# Patient Record
Sex: Female | Born: 1940 | Race: White | Hispanic: No | Smoking: Never smoker
Health system: Southern US, Community
[De-identification: ages and names within clinical notes are randomized; demographics above are authoritative.]

## PROBLEM LIST (undated history)

## (undated) DIAGNOSIS — C50919 Malignant neoplasm of unspecified site of unspecified female breast: Secondary | ICD-10-CM

## (undated) DIAGNOSIS — M1990 Unspecified osteoarthritis, unspecified site: Secondary | ICD-10-CM

## (undated) DIAGNOSIS — H9319 Tinnitus, unspecified ear: Secondary | ICD-10-CM

## (undated) DIAGNOSIS — G629 Polyneuropathy, unspecified: Secondary | ICD-10-CM

## (undated) DIAGNOSIS — I10 Essential (primary) hypertension: Secondary | ICD-10-CM

## (undated) DIAGNOSIS — M4800 Spinal stenosis, site unspecified: Secondary | ICD-10-CM

## (undated) DIAGNOSIS — I059 Rheumatic mitral valve disease, unspecified: Secondary | ICD-10-CM

## (undated) DIAGNOSIS — E7800 Pure hypercholesterolemia, unspecified: Secondary | ICD-10-CM

## (undated) DIAGNOSIS — I38 Endocarditis, valve unspecified: Secondary | ICD-10-CM

## (undated) HISTORY — PX: JOINT REPLACEMENT: SHX530

## (undated) HISTORY — PX: TONSILLECTOMY: SUR1361

## (undated) HISTORY — PX: OTHER SURGICAL HISTORY: SHX169

## (undated) HISTORY — PX: COSMETIC SURGERY: SHX468

## (undated) HISTORY — PX: EYE SURGERY: SHX253

## (undated) HISTORY — PX: BREAST SURGERY: SHX581

---

## 1984-03-28 HISTORY — PX: ABDOMINAL HYSTERECTOMY: SHX81

## 1996-01-24 HISTORY — PX: TOTAL MASTECTOMY: SHX6129

## 1998-03-28 HISTORY — PX: BREAST SURGERY: SHX581

## 1998-06-30 ENCOUNTER — Other Ambulatory Visit: Admission: RE | Admit: 1998-06-30 | Discharge: 1998-06-30 | Payer: Self-pay | Admitting: Gastroenterology

## 2003-04-11 ENCOUNTER — Ambulatory Visit (HOSPITAL_COMMUNITY): Admission: RE | Admit: 2003-04-11 | Discharge: 2003-04-11 | Payer: Self-pay | Admitting: Gastroenterology

## 2008-05-21 ENCOUNTER — Ambulatory Visit: Payer: Self-pay | Admitting: Internal Medicine

## 2008-06-06 ENCOUNTER — Ambulatory Visit: Payer: Self-pay | Admitting: Internal Medicine

## 2010-03-26 HISTORY — PX: MOHS SURGERY: SUR867

## 2010-04-17 ENCOUNTER — Encounter: Payer: Self-pay | Admitting: Gastroenterology

## 2013-03-28 HISTORY — PX: CATARACT EXTRACTION, BILATERAL: SHX1313

## 2014-10-20 ENCOUNTER — Encounter: Payer: Self-pay | Admitting: Internal Medicine

## 2017-06-19 ENCOUNTER — Ambulatory Visit (INDEPENDENT_AMBULATORY_CARE_PROVIDER_SITE_OTHER): Payer: Medicare Other

## 2017-06-19 ENCOUNTER — Encounter (INDEPENDENT_AMBULATORY_CARE_PROVIDER_SITE_OTHER): Payer: Self-pay | Admitting: Orthopaedic Surgery

## 2017-06-19 ENCOUNTER — Ambulatory Visit (INDEPENDENT_AMBULATORY_CARE_PROVIDER_SITE_OTHER): Payer: Medicare Other | Admitting: Orthopaedic Surgery

## 2017-06-19 DIAGNOSIS — G8929 Other chronic pain: Secondary | ICD-10-CM

## 2017-06-19 DIAGNOSIS — M5441 Lumbago with sciatica, right side: Secondary | ICD-10-CM

## 2017-06-19 DIAGNOSIS — M1611 Unilateral primary osteoarthritis, right hip: Secondary | ICD-10-CM

## 2017-06-19 DIAGNOSIS — M25551 Pain in right hip: Secondary | ICD-10-CM | POA: Insufficient documentation

## 2017-06-19 DIAGNOSIS — M1612 Unilateral primary osteoarthritis, left hip: Secondary | ICD-10-CM | POA: Insufficient documentation

## 2017-06-19 HISTORY — DX: Other chronic pain: G89.29

## 2017-06-19 HISTORY — DX: Unilateral primary osteoarthritis, left hip: M16.12

## 2017-06-19 HISTORY — DX: Pain in right hip: M25.551

## 2017-06-19 HISTORY — DX: Unilateral primary osteoarthritis, right hip: M16.11

## 2017-06-19 NOTE — Progress Notes (Signed)
Dependent review of plain films of both hips show  Office Visit Note   Patient: Kristi Wiley           Date of Birth: 07-23-1940           MRN: [id number] Visit Date: 06/19/2017              Requested by: No referring provider defined for this encounter. PCP: Reita Cliche, MD   Assessment & Plan: Visit Diagnoses:  1. Chronic bilateral low back pain with right-sided sciatica   2. Unilateral primary osteoarthritis, left hip   3. Unilateral primary osteoarthritis, right hip   4. Pain of right hip joint     Plan: Given the severity of her pain and the detrimental effects is on her mobility and her activity daily living we are recommending hip replacement surgery.  This also correlates with her clinical exam and x-rays.  She would like to think about this.  I talked in detail about the risk and benefits of the surgery and what her intraoperative and postoperative course were involved.  She will continue meloxicam for now.  All questions and concerns were answered and addressed.  I have been talked about the possibility of intra-articular steroid injection to help temporize her symptoms.  She says she will take all these things in consideration and give [place] a call with what she would like to try next.  Follow-Up Instructions: Return if symptoms worsen or fail to improve.   Orders:  Orders Placed This Encounter  Procedures  . XR Lumbar Spine 2-3 Views   No orders of the defined types were placed in this encounter.     Procedures: No procedures performed   Clinical Data: No additional findings.   Subjective: Chief Complaint  Patient presents with  . Right Leg - Pain  The patient comes in with a chief complaint of bilateral hip pain with the right worse than left as well as hip stiffness but she also has pain going down her right leg in the thigh area and sometimes numbness and tingling in her right toes.  She is not a diabetic.  She was started on meloxicam by her primary care  physician and she says that this did take the edge of the things that she has to take Prilosec with the meloxicam.  Her pain is daily and is detrimental effect directives daily living, quality of life, mobility.  It affects her mobility mainly.  It does wake her up at night and causes pain with activities.  She denies any injuries that she is aware of.  Is been slowly worsening for well over a year now but got quite severe in December.  HPI  Review of Systems She currently denies any headache, chest pain, shortness of breath, fever, chills, nausea, vomiting.  Objective: Vital Signs: There were no vitals taken for this visit.  Physical Exam She is alert and oriented x3 and in no acute distress Ortho Exam Examination of both hips shows significant limitations with internal/external rotation and pain in the groin with internal or external rotation of both hips with the right worse than left.  She has good strength in her bilateral lower extremities and no numbness and tingling today.   Specialty Comments:  No specialty comments available.  Imaging: Xr Lumbar Spine 2-3 Views  Result Date: 06/19/2017 2 views of the lumbar spine show mild to moderate arthritic changes at several levels.  She has severe degenerative disc disease at L5-S1 versus a  transitional vertebra.  Severe arthritic changes of both hips with joint space narrowing, sclerotic changes and large periarticular osteophytes.  PMFS History: Patient Active Problem List   Diagnosis Date Noted  . Pain of right hip joint 06/19/2017  . Unilateral primary osteoarthritis, 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  . Unilateral primary osteoarthritis, 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  . 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   History reviewed. No pertinent past medical history.  History reviewed. No pertinent family history.  History reviewed. No pertinent surgical history. Social History   Occupational History  . Not on file    Tobacco Use  . Smoking status: Not on file  Substance and Sexual Activity  . Alcohol use: Not on file  . Drug use: Not on file  . Sexual activity: Not on file

## 2017-09-04 ENCOUNTER — Other Ambulatory Visit (INDEPENDENT_AMBULATORY_CARE_PROVIDER_SITE_OTHER): Payer: Self-pay

## 2017-09-22 ENCOUNTER — Other Ambulatory Visit (INDEPENDENT_AMBULATORY_CARE_PROVIDER_SITE_OTHER): Payer: Self-pay | Admitting: Physician Assistant

## 2017-09-29 NOTE — Progress Notes (Signed)
[facility] DR Donnetta Hutching 03-30-17 Epic

## 2017-09-29 NOTE — Patient Instructions (Addendum)
Kristi Wiley  09/29/2017   Your procedure is scheduled on: 10-06-17   Report to [facility] Main  Entrance    Report to admitting at 6:00AM, surgery will start at 8:45am!    Call this number if you have problems the morning of surgery [phone]     Remember: Do not eat food or drink liquids :After Midnight.     Take these medicines the morning of surgery with A SIP OF WATER: EYE DROPS, LABETALOL, PRAVASTATIN                                You may not have any metal on your body including hair pins and              piercings  Do not wear jewelry, make-up, lotions, powders or perfumes, deodorant             Do not wear nail polish.  Do not shave  48 hours prior to surgery.              Do not bring valuables to the hospital. [place].  Contacts, dentures or bridgework may not be worn into surgery.  Leave suitcase in the car. After surgery it may be brought to your room.                  Please read over the following fact sheets you were given: _____________________________________________________________________             [facility] - Preparing for Surgery Before surgery, you can play an important role.  Because skin is not sterile, your skin needs to be as free of germs as possible.  You can reduce the number of germs on your skin by washing with CHG (chlorahexidine gluconate) soap before surgery.  CHG is an antiseptic cleaner which kills germs and bonds with the skin to continue killing germs even after washing. Please DO NOT use if you have an allergy to CHG or antibacterial soaps.  If your skin becomes reddened/irritated stop using the CHG and inform your nurse when you arrive at Short Stay. Do not shave (including legs and underarms) for at least 48 hours prior to the first CHG shower.  You may shave your face/neck. Please follow these instructions carefully:  1.  Shower with CHG Soap the night  before surgery and the  morning of Surgery.  2.  If you choose to wash your hair, wash your hair first as usual with your  normal  shampoo.  3.  After you shampoo, rinse your hair and body thoroughly to remove the  shampoo.                           4.  Use CHG as you would any other liquid soap.  You can apply chg directly  to the skin and wash                       Gently with a scrungie or clean washcloth.  5.  Apply the CHG Soap to your body ONLY FROM THE NECK DOWN.   Do not use on face/ open  Wound or open sores. Avoid contact with eyes, ears mouth and genitals (private parts).                       Wash face,  Genitals (private parts) with your normal soap.             6.  Wash thoroughly, paying special attention to the area where your surgery  will be performed.  7.  Thoroughly rinse your body with warm water from the neck down.  8.  DO NOT shower/wash with your normal soap after using and rinsing off  the CHG Soap.                9.  Pat yourself dry with a clean towel.            10.  Wear clean pajamas.            11.  Place clean sheets on your bed the night of your first shower and do not  sleep with pets. Day of Surgery : Do not apply any lotions/deodorants the morning of surgery.  Please wear clean clothes to the hospital/surgery center.  FAILURE TO FOLLOW THESE INSTRUCTIONS MAY RESULT IN THE CANCELLATION OF YOUR SURGERY PATIENT SIGNATURE_________________________________  NURSE SIGNATURE__________________________________  ________________________________________________________________________   Adam Phenix  An incentive spirometer is a tool that can help keep your lungs clear and active. This tool measures how well you are filling your lungs with each breath. Taking long deep breaths may help reverse or decrease the chance of developing breathing (pulmonary) problems (especially infection) following:  A long period of time when you are  unable to move or be active. BEFORE THE PROCEDURE   If the spirometer includes an indicator to show your best effort, your nurse or respiratory therapist will set it to a desired goal.  If possible, sit up straight or lean slightly forward. Try not to slouch.  Hold the incentive spirometer in an upright position. INSTRUCTIONS FOR USE  1. Sit on the edge of your bed if possible, or sit up as far as you can in bed or on a chair. 2. Hold the incentive spirometer in an upright position. 3. Breathe out normally. 4. Place the mouthpiece in your mouth and seal your lips tightly around it. 5. Breathe in slowly and as deeply as possible, raising the piston or the ball toward the top of the column. 6. Hold your breath for 3-5 seconds or for as long as possible. Allow the piston or ball to fall to the bottom of the column. 7. Remove the mouthpiece from your mouth and breathe out normally. 8. Rest for a few seconds and repeat Steps 1 through 7 at least 10 times every 1-2 hours when you are awake. Take your time and take a few normal breaths between deep breaths. 9. The spirometer may include an indicator to show your best effort. Use the indicator as a goal to work toward during each repetition. 10. After each set of 10 deep breaths, practice coughing to be sure your lungs are clear. If you have an incision (the cut made at the time of surgery), support your incision when coughing by placing a pillow or rolled up towels firmly against it. Once you are able to get out of bed, walk around indoors and cough well. You may stop using the incentive spirometer when instructed by your caregiver.  RISKS AND COMPLICATIONS  Take your time so you do not get  dizzy or light-headed.  If you are in pain, you may need to take or ask for pain medication before doing incentive spirometry. It is harder to take a deep breath if you are having pain. AFTER USE  Rest and breathe slowly and easily.  It can be helpful to  keep track of a log of your progress. Your caregiver can provide you with a simple table to help with this. If you are using the spirometer at home, follow these instructions: [place] IF:   You are having difficultly using the spirometer.  You have trouble using the spirometer as often as instructed.  Your pain medication is not giving enough relief while using the spirometer.  You develop fever of 100.5 F (38.1 C) or higher. SEEK IMMEDIATE MEDICAL CARE IF:   You cough up bloody sputum that had not been present before.  You develop fever of 102 F (38.9 C) or greater.  You develop worsening pain at or near the incision site. MAKE SURE YOU:   Understand these instructions.  Will watch your condition.  Will get help right away if you are not doing well or get worse. Document Released: 07/25/2006 Document Revised: 06/06/2011 Document Reviewed: 09/25/2006 [facility] Patient Information 2014 [place], [place].   ________________________________________________________________________

## 2017-10-02 ENCOUNTER — Encounter (HOSPITAL_COMMUNITY): Payer: Self-pay | Admitting: Emergency Medicine

## 2017-10-02 ENCOUNTER — Other Ambulatory Visit: Payer: Self-pay

## 2017-10-02 ENCOUNTER — Encounter (HOSPITAL_COMMUNITY)
Admission: RE | Admit: 2017-10-02 | Discharge: 2017-10-02 | Disposition: A | Discharge status: Home / Self Care | Payer: Medicare Other | Source: Ambulatory Visit | Attending: Orthopaedic Surgery | Admitting: Orthopaedic Surgery

## 2017-10-02 DIAGNOSIS — R001 Bradycardia, unspecified: Secondary | ICD-10-CM | POA: Diagnosis not present

## 2017-10-02 DIAGNOSIS — Z01810 Encounter for preprocedural cardiovascular examination: Secondary | ICD-10-CM | POA: Insufficient documentation

## 2017-10-02 DIAGNOSIS — I10 Essential (primary) hypertension: Secondary | ICD-10-CM | POA: Diagnosis not present

## 2017-10-02 DIAGNOSIS — Z01812 Encounter for preprocedural laboratory examination: Secondary | ICD-10-CM | POA: Diagnosis not present

## 2017-10-02 HISTORY — DX: Tinnitus, unspecified ear: H93.19

## 2017-10-02 HISTORY — DX: Malignant neoplasm of unspecified site of unspecified female breast: C50.919

## 2017-10-02 HISTORY — DX: Essential (primary) hypertension: I10

## 2017-10-02 HISTORY — DX: Unspecified osteoarthritis, unspecified site: M19.90

## 2017-10-02 HISTORY — DX: Rheumatic mitral valve disease, unspecified: I05.9

## 2017-10-02 HISTORY — DX: Pure hypercholesterolemia, unspecified: E78.00

## 2017-10-02 LAB — CBC
HCT: 39.9 % (ref 36.0–46.0)
HEMOGLOBIN: 13.6 g/dL (ref 12.0–15.0)
MCH: 30.5 pg (ref 26.0–34.0)
MCHC: 34.1 g/dL (ref 30.0–36.0)
MCV: 89.5 fL (ref 78.0–100.0)
Platelets: 242 10*3/uL (ref 150–400)
RBC: 4.46 MIL/uL (ref 3.87–5.11)
RDW: 13.1 % (ref 11.5–15.5)
WBC: 7.1 10*3/uL (ref 4.0–10.5)

## 2017-10-02 LAB — BASIC METABOLIC PANEL
ANION GAP: 7 (ref 5–15)
BUN: 22 mg/dL (ref 8–23)
CHLORIDE: 105 mmol/L (ref 98–111)
CO2: 30 mmol/L (ref 22–32)
Calcium: 9.5 mg/dL (ref 8.9–10.3)
Creatinine, Ser: 0.82 mg/dL (ref 0.44–1.00)
GLUCOSE: 102 mg/dL — AB (ref 70–99)
POTASSIUM: 3.8 mmol/L (ref 3.5–5.1)
SODIUM: 142 mmol/L (ref 135–145)

## 2017-10-02 LAB — SURGICAL PCR SCREEN
MRSA, PCR: NEGATIVE
STAPHYLOCOCCUS AUREUS: NEGATIVE

## 2017-10-02 NOTE — Progress Notes (Signed)
FAX REQUEST SENT TO Cardiac Ultrasound - [place] Bldg REQUESTING LAST ECHO RESULTS

## 2017-10-03 NOTE — Progress Notes (Signed)
ECHO 04-21-17 on chart from [place]

## 2017-10-05 ENCOUNTER — Encounter (HOSPITAL_COMMUNITY): Payer: Self-pay | Admitting: Anesthesiology

## 2017-10-05 MED ORDER — SODIUM CHLORIDE 0.9 % IV SOLN
1000.0000 mg | INTRAVENOUS | Status: AC
  Administered 2017-10-06: 1000 mg via INTRAVENOUS
  Filled 2017-10-05: qty 1100

## 2017-10-05 NOTE — Anesthesia Preprocedure Evaluation (Addendum)
Anesthesia Evaluation  Patient identified by MRN, date of birth, ID band Patient awake    Reviewed: Allergy & Precautions, NPO status , Patient's Chart, lab work & pertinent test results  History of Anesthesia Complications Negative for: history of anesthetic complications  Airway Mallampati: II  TM Distance: >3 FB Neck ROM: Full    Dental  (+) Teeth Intact   Pulmonary neg pulmonary ROS,    breath sounds clear to auscultation       Cardiovascular hypertension, Pt. on medications and Pt. 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  Rhythm:Regular  EF nl, 2+ MR   Neuro/Psych neg Seizures  Neuromuscular disease negative psych ROS   GI/Hepatic negative GI ROS, Neg liver ROS,   Endo/Other  negative endocrine ROS  Renal/GU negative Renal ROS     Musculoskeletal  (+) Arthritis ,   Abdominal   Peds  Hematology negative hematology ROS (+)   Anesthesia Other Findings   Reproductive/Obstetrics                            Anesthesia Physical Anesthesia Plan  ASA: II  Anesthesia Plan: Spinal and MAC   Post-op Pain Management:    Induction:   PONV Risk Score and Plan: 2 and Treatment may vary due to age or medical condition  Airway Management Planned: Natural Airway, Simple Face Mask and Nasal Cannula  Additional Equipment: None  Intra-op Plan:   Post-operative Plan:   Informed Consent: I have reviewed the patients History and Physical, chart, labs and discussed the procedure including the risks, benefits and alternatives for the proposed anesthesia with the patient or authorized representative who has indicated his/her understanding and acceptance.   Dental advisory given  Plan Discussed with: CRNA and Surgeon  Anesthesia Plan Comments:        Anesthesia Quick Evaluation

## 2017-10-06 ENCOUNTER — Inpatient Hospital Stay (HOSPITAL_COMMUNITY)
Admission: RE | Admit: 2017-10-06 | Discharge: 2017-10-08 | DRG: 470 | Disposition: A | Discharge status: Home Health | Payer: Medicare Other | Attending: Orthopaedic Surgery | Admitting: Orthopaedic Surgery

## 2017-10-06 ENCOUNTER — Inpatient Hospital Stay (HOSPITAL_COMMUNITY): Payer: Medicare Other | Admitting: Anesthesiology

## 2017-10-06 ENCOUNTER — Encounter (HOSPITAL_COMMUNITY)
Admission: RE | Disposition: A | Discharge status: Home Health | Payer: Self-pay | Source: Home / Self Care | Attending: Orthopaedic Surgery

## 2017-10-06 ENCOUNTER — Other Ambulatory Visit: Payer: Self-pay

## 2017-10-06 ENCOUNTER — Inpatient Hospital Stay (HOSPITAL_COMMUNITY): Payer: Medicare Other

## 2017-10-06 ENCOUNTER — Encounter (HOSPITAL_COMMUNITY): Payer: Self-pay | Admitting: Emergency Medicine

## 2017-10-06 DIAGNOSIS — Z85828 Personal history of other malignant neoplasm of skin: Secondary | ICD-10-CM

## 2017-10-06 DIAGNOSIS — Z882 Allergy status to sulfonamides status: Secondary | ICD-10-CM

## 2017-10-06 DIAGNOSIS — Z9181 History of falling: Secondary | ICD-10-CM

## 2017-10-06 DIAGNOSIS — Z9089 Acquired absence of other organs: Secondary | ICD-10-CM | POA: Diagnosis not present

## 2017-10-06 DIAGNOSIS — Z9011 Acquired absence of right breast and nipple: Secondary | ICD-10-CM

## 2017-10-06 DIAGNOSIS — Z91040 Latex allergy status: Secondary | ICD-10-CM

## 2017-10-06 DIAGNOSIS — M5441 Lumbago with sciatica, right side: Secondary | ICD-10-CM | POA: Diagnosis present

## 2017-10-06 DIAGNOSIS — M160 Bilateral primary osteoarthritis of hip: Principal | ICD-10-CM | POA: Diagnosis present

## 2017-10-06 DIAGNOSIS — M25751 Osteophyte, right hip: Secondary | ICD-10-CM | POA: Diagnosis not present

## 2017-10-06 DIAGNOSIS — Q239 Congenital malformation of aortic and mitral valves, unspecified: Secondary | ICD-10-CM | POA: Diagnosis not present

## 2017-10-06 DIAGNOSIS — Z888 Allergy status to other drugs, medicaments and biological substances status: Secondary | ICD-10-CM | POA: Diagnosis not present

## 2017-10-06 DIAGNOSIS — G8929 Other chronic pain: Secondary | ICD-10-CM | POA: Diagnosis present

## 2017-10-06 DIAGNOSIS — M1610 Unilateral primary osteoarthritis, unspecified hip: Secondary | ICD-10-CM

## 2017-10-06 DIAGNOSIS — Z90710 Acquired absence of both cervix and uterus: Secondary | ICD-10-CM | POA: Diagnosis not present

## 2017-10-06 DIAGNOSIS — Z853 Personal history of malignant neoplasm of breast: Secondary | ICD-10-CM | POA: Diagnosis not present

## 2017-10-06 DIAGNOSIS — Z9841 Cataract extraction status, right eye: Secondary | ICD-10-CM

## 2017-10-06 DIAGNOSIS — E7800 Pure hypercholesterolemia, unspecified: Secondary | ICD-10-CM | POA: Diagnosis not present

## 2017-10-06 DIAGNOSIS — Z96641 Presence of right artificial hip joint: Secondary | ICD-10-CM

## 2017-10-06 DIAGNOSIS — M1611 Unilateral primary osteoarthritis, right hip: Secondary | ICD-10-CM | POA: Diagnosis present

## 2017-10-06 DIAGNOSIS — I10 Essential (primary) hypertension: Secondary | ICD-10-CM | POA: Diagnosis present

## 2017-10-06 DIAGNOSIS — Z9842 Cataract extraction status, left eye: Secondary | ICD-10-CM

## 2017-10-06 HISTORY — PX: TOTAL HIP ARTHROPLASTY: SHX124

## 2017-10-06 HISTORY — DX: Presence of right artificial hip joint: Z96.641

## 2017-10-06 SURGERY — ARTHROPLASTY, HIP, TOTAL, ANTERIOR APPROACH
Anesthesia: Monitor Anesthesia Care | Site: Hip | Laterality: Right

## 2017-10-06 MED ORDER — 0.9 % SODIUM CHLORIDE (POUR BTL) OPTIME
TOPICAL | Status: DC | PRN
  Administered 2017-10-06: 1000 mL

## 2017-10-06 MED ORDER — HYDROCODONE-ACETAMINOPHEN 5-325 MG PO TABS
1.0000 | ORAL_TABLET | ORAL | Status: DC | PRN
  Administered 2017-10-07: 1 via ORAL
  Filled 2017-10-06: qty 1

## 2017-10-06 MED ORDER — LIDOCAINE 2% (20 MG/ML) 5 ML SYRINGE
INTRAMUSCULAR | Status: AC
  Filled 2017-10-06: qty 5

## 2017-10-06 MED ORDER — PANTOPRAZOLE SODIUM 40 MG PO TBEC
40.0000 mg | DELAYED_RELEASE_TABLET | Freq: Every day | ORAL | Status: DC
  Administered 2017-10-06 – 2017-10-08 (×3): 40 mg via ORAL
  Filled 2017-10-06 (×3): qty 1

## 2017-10-06 MED ORDER — PROPOFOL 10 MG/ML IV BOLUS
INTRAVENOUS | Status: AC
  Filled 2017-10-06: qty 20

## 2017-10-06 MED ORDER — FENTANYL CITRATE (PF) 100 MCG/2ML IJ SOLN: 25.0000 ug | INTRAMUSCULAR | Status: DC | PRN

## 2017-10-06 MED ORDER — DIPHENHYDRAMINE HCL 12.5 MG/5ML PO ELIX: 12.5000 mg | ORAL_SOLUTION | ORAL | Status: DC | PRN

## 2017-10-06 MED ORDER — LOSARTAN POTASSIUM 50 MG PO TABS
100.0000 mg | ORAL_TABLET | Freq: Every day | ORAL | Status: DC
  Administered 2017-10-06 – 2017-10-07 (×2): 100 mg via ORAL
  Filled 2017-10-06 (×2): qty 2

## 2017-10-06 MED ORDER — ACETAMINOPHEN 325 MG PO TABS: 325.0000 mg | ORAL_TABLET | Freq: Four times a day (QID) | ORAL | Status: DC | PRN

## 2017-10-06 MED ORDER — POLYVINYL ALCOHOL 1.4 % OP SOLN
1.0000 [drp] | Freq: Three times a day (TID) | OPHTHALMIC | Status: DC | PRN
  Filled 2017-10-06: qty 15

## 2017-10-06 MED ORDER — DEXAMETHASONE SODIUM PHOSPHATE 10 MG/ML IJ SOLN
INTRAMUSCULAR | Status: DC | PRN
  Administered 2017-10-06: 10 mg via INTRAVENOUS

## 2017-10-06 MED ORDER — EPHEDRINE SULFATE-NACL 50-0.9 MG/10ML-% IV SOSY
PREFILLED_SYRINGE | INTRAVENOUS | Status: DC | PRN
  Administered 2017-10-06 (×3): 10 mg via INTRAVENOUS

## 2017-10-06 MED ORDER — SODIUM CHLORIDE 0.9 % IV SOLN
INTRAVENOUS | Status: DC
  Administered 2017-10-06 – 2017-10-07 (×2): via INTRAVENOUS

## 2017-10-06 MED ORDER — ALUM & MAG HYDROXIDE-SIMETH 200-200-20 MG/5ML PO SUSP
30.0000 mL | ORAL | Status: DC | PRN
Start: 2017-10-06 — End: 2017-10-08

## 2017-10-06 MED ORDER — LOSARTAN POTASSIUM-HCTZ 100-12.5 MG PO TABS: 1.0000 | ORAL_TABLET | Freq: Every day | ORAL | Status: DC

## 2017-10-06 MED ORDER — MORPHINE SULFATE (PF) 2 MG/ML IV SOLN: 0.5000 mg | INTRAVENOUS | Status: DC | PRN

## 2017-10-06 MED ORDER — PROPOFOL 500 MG/50ML IV EMUL
INTRAVENOUS | Status: DC | PRN
  Administered 2017-10-06: 75 ug/kg/min via INTRAVENOUS

## 2017-10-06 MED ORDER — ASPIRIN 81 MG PO CHEW
81.0000 mg | CHEWABLE_TABLET | Freq: Two times a day (BID) | ORAL | Status: DC
  Administered 2017-10-06 – 2017-10-08 (×4): 81 mg via ORAL
  Filled 2017-10-06 (×4): qty 1

## 2017-10-06 MED ORDER — FENTANYL CITRATE (PF) 100 MCG/2ML IJ SOLN
INTRAMUSCULAR | Status: DC | PRN
  Administered 2017-10-06 (×2): 50 ug via INTRAVENOUS

## 2017-10-06 MED ORDER — OXYCODONE HCL 5 MG PO TABS: 5.0000 mg | ORAL_TABLET | Freq: Once | ORAL | Status: DC | PRN

## 2017-10-06 MED ORDER — METOCLOPRAMIDE HCL 5 MG/ML IJ SOLN: 5.0000 mg | Freq: Three times a day (TID) | INTRAMUSCULAR | Status: DC | PRN

## 2017-10-06 MED ORDER — LIDOCAINE 2% (20 MG/ML) 5 ML SYRINGE
INTRAMUSCULAR | Status: DC | PRN
  Administered 2017-10-06: 100 mg via INTRAVENOUS

## 2017-10-06 MED ORDER — BUPIVACAINE IN DEXTROSE 0.75-8.25 % IT SOLN
INTRATHECAL | Status: DC | PRN
  Administered 2017-10-06: 1.8 mL via INTRATHECAL

## 2017-10-06 MED ORDER — HYPROMELLOSE (GONIOSCOPIC) 2.5 % OP SOLN: 1.0000 [drp] | Freq: Three times a day (TID) | OPHTHALMIC | Status: DC | PRN

## 2017-10-06 MED ORDER — METHOCARBAMOL 500 MG PO TABS
500.0000 mg | ORAL_TABLET | Freq: Four times a day (QID) | ORAL | Status: DC | PRN
  Administered 2017-10-07: 500 mg via ORAL
  Filled 2017-10-06: qty 1

## 2017-10-06 MED ORDER — FENTANYL CITRATE (PF) 100 MCG/2ML IJ SOLN
INTRAMUSCULAR | Status: AC
  Filled 2017-10-06: qty 2

## 2017-10-06 MED ORDER — POLYETHYLENE GLYCOL 3350 17 G PO PACK: 17.0000 g | PACK | Freq: Every day | ORAL | Status: DC | PRN

## 2017-10-06 MED ORDER — CEFAZOLIN SODIUM-DEXTROSE 1-4 GM/50ML-% IV SOLN
1.0000 g | Freq: Four times a day (QID) | INTRAVENOUS | Status: AC
  Administered 2017-10-06 (×2): 1 g via INTRAVENOUS
  Filled 2017-10-06: qty 50

## 2017-10-06 MED ORDER — ONDANSETRON HCL 4 MG/2ML IJ SOLN
INTRAMUSCULAR | Status: DC | PRN
  Administered 2017-10-06: 4 mg via INTRAVENOUS

## 2017-10-06 MED ORDER — BIOTIN 10000 MCG PO TABS: 10000.0000 ug | ORAL_TABLET | Freq: Every day | ORAL | Status: DC

## 2017-10-06 MED ORDER — DOCUSATE SODIUM 100 MG PO CAPS
100.0000 mg | ORAL_CAPSULE | Freq: Two times a day (BID) | ORAL | Status: DC
  Administered 2017-10-06 – 2017-10-08 (×4): 100 mg via ORAL
  Filled 2017-10-06 (×4): qty 1

## 2017-10-06 MED ORDER — PHENOL 1.4 % MT LIQD
1.0000 | OROMUCOSAL | Status: DC | PRN
  Filled 2017-10-06: qty 177

## 2017-10-06 MED ORDER — CHLORHEXIDINE GLUCONATE 4 % EX LIQD: 60.0000 mL | Freq: Once | CUTANEOUS | Status: DC

## 2017-10-06 MED ORDER — ONDANSETRON HCL 4 MG/2ML IJ SOLN: 4.0000 mg | Freq: Four times a day (QID) | INTRAMUSCULAR | Status: DC | PRN

## 2017-10-06 MED ORDER — PROPOFOL 10 MG/ML IV BOLUS
INTRAVENOUS | Status: AC
  Filled 2017-10-06: qty 40

## 2017-10-06 MED ORDER — VITAMIN D3 25 MCG (1000 UNIT) PO TABS
1000.0000 [IU] | ORAL_TABLET | Freq: Every day | ORAL | Status: DC
  Administered 2017-10-06 – 2017-10-08 (×3): 1000 [IU] via ORAL
  Filled 2017-10-06 (×3): qty 1

## 2017-10-06 MED ORDER — OXYCODONE HCL 5 MG/5ML PO SOLN
5.0000 mg | Freq: Once | ORAL | Status: DC | PRN
  Filled 2017-10-06: qty 5

## 2017-10-06 MED ORDER — SODIUM CHLORIDE 0.9 % IR SOLN
Status: DC | PRN
  Administered 2017-10-06: 1000 mL

## 2017-10-06 MED ORDER — PROPOFOL 10 MG/ML IV BOLUS
INTRAVENOUS | Status: DC | PRN
  Administered 2017-10-06: 20 mg via INTRAVENOUS

## 2017-10-06 MED ORDER — PRAVASTATIN SODIUM 20 MG PO TABS
40.0000 mg | ORAL_TABLET | Freq: Every day | ORAL | Status: DC
  Administered 2017-10-07 – 2017-10-08 (×2): 40 mg via ORAL
  Filled 2017-10-06 (×2): qty 2

## 2017-10-06 MED ORDER — LABETALOL HCL 100 MG PO TABS
200.0000 mg | ORAL_TABLET | Freq: Two times a day (BID) | ORAL | Status: DC
  Administered 2017-10-06 – 2017-10-08 (×4): 200 mg via ORAL
  Filled 2017-10-06 (×4): qty 2

## 2017-10-06 MED ORDER — HYDROCHLOROTHIAZIDE 12.5 MG PO CAPS
12.5000 mg | ORAL_CAPSULE | Freq: Every day | ORAL | Status: DC
  Administered 2017-10-06 – 2017-10-07 (×2): 12.5 mg via ORAL
  Filled 2017-10-06 (×2): qty 1

## 2017-10-06 MED ORDER — LACTATED RINGERS IV SOLN
INTRAVENOUS | Status: DC
  Administered 2017-10-06 (×2): via INTRAVENOUS

## 2017-10-06 MED ORDER — MENTHOL 3 MG MT LOZG: 1.0000 | LOZENGE | OROMUCOSAL | Status: DC | PRN

## 2017-10-06 MED ORDER — STERILE WATER FOR IRRIGATION IR SOLN
Status: DC | PRN
  Administered 2017-10-06: 2000 mL

## 2017-10-06 MED ORDER — EPHEDRINE 5 MG/ML INJ
INTRAVENOUS | Status: AC
  Filled 2017-10-06: qty 10

## 2017-10-06 MED ORDER — METOCLOPRAMIDE HCL 5 MG PO TABS: 5.0000 mg | ORAL_TABLET | Freq: Three times a day (TID) | ORAL | Status: DC | PRN

## 2017-10-06 MED ORDER — CEFAZOLIN SODIUM-DEXTROSE 2-4 GM/100ML-% IV SOLN
2.0000 g | INTRAVENOUS | Status: AC
  Administered 2017-10-06: 2 g via INTRAVENOUS
  Filled 2017-10-06: qty 100

## 2017-10-06 MED ORDER — METHOCARBAMOL 1000 MG/10ML IJ SOLN
500.0000 mg | Freq: Four times a day (QID) | INTRAVENOUS | Status: DC | PRN
  Administered 2017-10-06: 500 mg via INTRAVENOUS
  Filled 2017-10-06: qty 550

## 2017-10-06 MED ORDER — ONDANSETRON HCL 4 MG PO TABS: 4.0000 mg | ORAL_TABLET | Freq: Four times a day (QID) | ORAL | Status: DC | PRN

## 2017-10-06 MED ORDER — HYDROCODONE-ACETAMINOPHEN 7.5-325 MG PO TABS
1.0000 | ORAL_TABLET | ORAL | Status: DC | PRN
  Administered 2017-10-06 (×3): 1 via ORAL
  Administered 2017-10-07: 2 via ORAL
  Administered 2017-10-07 – 2017-10-08 (×2): 1 via ORAL
  Filled 2017-10-06: qty 1
  Filled 2017-10-06: qty 2
  Filled 2017-10-06 (×2): qty 1
  Filled 2017-10-06: qty 2
  Filled 2017-10-06: qty 1

## 2017-10-06 SURGICAL SUPPLY — 38 items
BAG ZIPLOCK 12X15 (MISCELLANEOUS) IMPLANT
BENZOIN TINCTURE PRP APPL 2/3 (GAUZE/BANDAGES/DRESSINGS) ×3 IMPLANT
BLADE SAW SGTL 18X1.27X75 (BLADE) ×2 IMPLANT
BLADE SAW SGTL 18X1.27X75MM (BLADE) ×1
CAPT HIP TOTAL 2 ×3 IMPLANT
CLOSURE STERI-STRIP 1/4X4 (GAUZE/BANDAGES/DRESSINGS) ×3 IMPLANT
CLOSURE WOUND 1/2 X4 (GAUZE/BANDAGES/DRESSINGS)
COVER PERINEAL POST (MISCELLANEOUS) ×3 IMPLANT
COVER SURGICAL LIGHT HANDLE (MISCELLANEOUS) ×3 IMPLANT
DRAPE STERI IOBAN 125X83 (DRAPES) ×3 IMPLANT
DRAPE U-SHAPE 47X51 STRL (DRAPES) ×6 IMPLANT
DRESSING AQUACEL AG SP 3.5X10 (GAUZE/BANDAGES/DRESSINGS) ×1 IMPLANT
DRSG AQUACEL AG ADV 3.5X10 (GAUZE/BANDAGES/DRESSINGS) ×3 IMPLANT
DRSG AQUACEL AG SP 3.5X10 (GAUZE/BANDAGES/DRESSINGS) ×3
DURAPREP 26ML APPLICATOR (WOUND CARE) ×3 IMPLANT
ELECT REM PT RETURN 15FT ADLT (MISCELLANEOUS) ×3 IMPLANT
GAUZE XEROFORM 1X8 LF (GAUZE/BANDAGES/DRESSINGS) IMPLANT
GLOVE BIOGEL PI IND STRL 8 (GLOVE) ×2 IMPLANT
GLOVE BIOGEL PI INDICATOR 8 (GLOVE) ×4
GLOVE SURG SS PI 6.5 STRL IVOR (GLOVE) ×12 IMPLANT
GLOVE SURG SS PI 7.5 STRL IVOR (GLOVE) ×3 IMPLANT
GLOVE SURG SS PI 8.0 STRL IVOR (GLOVE) ×3 IMPLANT
GOWN STRL REUS W/TWL XL LVL3 (GOWN DISPOSABLE) ×6 IMPLANT
HANDPIECE INTERPULSE COAX TIP (DISPOSABLE) ×2
HOLDER FOLEY CATH W/STRAP (MISCELLANEOUS) ×3 IMPLANT
PACK ANTERIOR HIP CUSTOM (KITS) ×3 IMPLANT
SET HNDPC FAN SPRY TIP SCT (DISPOSABLE) ×1 IMPLANT
STAPLER VISISTAT 35W (STAPLE) IMPLANT
STRIP CLOSURE SKIN 1/2X4 (GAUZE/BANDAGES/DRESSINGS) IMPLANT
SUT ETHIBOND NAB CT1 #1 30IN (SUTURE) ×3 IMPLANT
SUT MNCRL AB 4-0 PS2 18 (SUTURE) IMPLANT
SUT VIC AB 0 CT1 36 (SUTURE) ×3 IMPLANT
SUT VIC AB 1 CT1 36 (SUTURE) ×3 IMPLANT
SUT VIC AB 2-0 CT1 27 (SUTURE) ×4
SUT VIC AB 2-0 CT1 TAPERPNT 27 (SUTURE) ×2 IMPLANT
TRAY FOL W/BAG SLVR 16FR STRL (SET/KITS/TRAYS/PACK) ×1 IMPLANT
TRAY FOLEY W/BAG SLVR 16FR LF (SET/KITS/TRAYS/PACK) ×2
YANKAUER SUCT BULB TIP 10FT TU (MISCELLANEOUS) ×3 IMPLANT

## 2017-10-06 NOTE — Brief Op Note (Signed)
10/06/2017  9:55 AM  PATIENT:  Kristi Wiley  77 y.o. female  PRE-OPERATIVE DIAGNOSIS:  osteoarthritis right hip  POST-OPERATIVE DIAGNOSIS:  osteoarthritis right hip  PROCEDURE:  Procedure(s): RIGHT TOTAL HIP ARTHROPLASTY ANTERIOR APPROACH (Right)  SURGEON:  Surgeon(s) and Role:    Mcarthur Rossetti, MD - Primary  PHYSICIAN ASSISTANT: Benita Stabile, PA-C assisted  ANESTHESIA:   spinal  EBL:  150 mL   COUNTS:  YES  DICTATION: .Other Dictation: Dictation Number [id number]  PLAN OF CARE: Admit to inpatient   PATIENT DISPOSITION:  PACU - hemodynamically stable.   Delay start of Pharmacological VTE agent (>24hrs) due to surgical blood loss or risk of bleeding: no

## 2017-10-06 NOTE — Anesthesia Procedure Notes (Signed)
Spinal  Patient location during procedure: OR End time: 10/06/2017 8:45 AM Staffing Resident/CRNA: Noralyn Pick D, CRNA Performed: resident/CRNA  Preanesthetic Checklist Completed: patient identified, site marked, surgical consent, pre-op evaluation, timeout performed, IV checked, risks and benefits discussed and monitors and equipment checked Spinal Block Patient position: sitting Prep: Betadine Patient monitoring: heart rate, continuous pulse ox and blood pressure Approach: right paramedian Location: L2-3 Injection technique: single-shot Needle Needle type: Sprotte  Needle gauge: 24 G Needle length: 9 cm Assessment Sensory level: T6 Additional Notes Expiration date of kit checked and confirmed. Patient tolerated procedure well, without complications.

## 2017-10-06 NOTE — Transfer of Care (Signed)
Immediate Anesthesia Transfer of Care Note  Patient: Kristi Wiley  Procedure(s) Performed: RIGHT TOTAL HIP ARTHROPLASTY ANTERIOR APPROACH (Right Hip)  Patient Location: PACU  Anesthesia Type:Spinal  Level of Consciousness: awake, alert  and oriented  Airway & Oxygen Therapy: Patient Spontanous Breathing and Patient connected to face mask oxygen  Post-op Assessment: Report given to RN and Post -op Vital signs reviewed and stable  Post vital signs: Reviewed and stable  Last Vitals:  Vitals Value Taken Time  BP    Temp    Pulse 54 10/06/2017 10:17 AM  Resp 12 10/06/2017 10:17 AM  SpO2 100 % 10/06/2017 10:17 AM  Vitals shown include unvalidated device data.  Last Pain:  Vitals:   10/06/17 0637  TempSrc:   PainSc: 0-No pain      Patients Stated Pain Goal: 4 (22/02/54 2706)  Complications: No apparent anesthesia complications

## 2017-10-06 NOTE — H&P (Signed)
TOTAL HIP ADMISSION H&P  Patient is admitted for right total hip arthroplasty.  Subjective:  Chief Complaint: right hip pain  HPI: Kristi Wiley, 77 y.o. female, has a history of pain and functional disability in the right hip(s) due to arthritis and patient has failed non-surgical conservative treatments for greater than 12 weeks to include NSAID's and/or analgesics, corticosteriod injections, flexibility and strengthening excercises and activity modification.  Onset of symptoms was gradual starting 3 years ago with gradually worsening course since that time.The patient noted no past surgery on the right hip(s).  Patient currently rates pain in the right hip at 10 out of 10 with activity. Patient has night pain, worsening of pain with activity and weight bearing, trendelenberg gait, pain that interfers with activities of daily living and pain with passive range of motion. Patient has evidence of subchondral cysts, subchondral sclerosis, periarticular osteophytes and joint space narrowing by imaging studies. This condition presents safety issues increasing the risk of falls.  There is no current active infection.  Patient Active Problem List   Diagnosis Date Noted  . Pain of right hip joint 06/19/2017  . Unilateral primary osteoarthritis, 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  . Unilateral primary osteoarthritis, 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  . 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   Past Medical History:  Diagnosis Date  . Arthritis    oa of right hip , left hip , left thumb   . Breast cancer ([place])   . Elevated cholesterol   . Hypertension   . Mitral valve disorder    sicne birth   . Tinnitus     Past Surgical History:  Procedure Laterality Date  . ABDOMINAL HYSTERECTOMY  1986  . BREAST SURGERY Right 2000   reconstructive    . CATARACT EXTRACTION, BILATERAL  2015  . eye cosmetic  Bilateral    [place]   . 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 TONSILLECTOMY    .  TOTAL MASTECTOMY Right 01/24/1996    Current Facility-Administered Medications  Medication Dose Route Frequency Provider Last Rate Last Dose  . ceFAZolin (ANCEF) IVPB 2g/100 mL premix  2 g Intravenous On Call to OR Pete Pelt, PA-C      . chlorhexidine (HIBICLENS) 4 % liquid 4 application  60 mL Topical Once Pete Pelt, PA-C      . lactated ringers infusion   Intravenous Continuous Oleta Mouse, MD 50 mL/hr at 10/06/17 [phone]    . tranexamic acid (CYKLOKAPRON) 1,000 mg in sodium chloride 0.9 % 100 mL IVPB  1,000 mg Intravenous To OR Mcarthur Rossetti, MD       Allergies  Allergen Reactions  . Nitrofurantoin Other ([place] Comments)    turned red in face  . Sulfonamide Derivatives Other ([place] Comments)    Face, ears chest turned real red  . Latex Rash    " swelling"     Social History   Tobacco Use  . Smoking status: Never Smoker  . Smokeless tobacco: Never Used  Substance Use Topics  . Alcohol use: Yes    Comment: rare , wine      History reviewed. No pertinent family history.   Review of Systems  Musculoskeletal: Positive for joint pain.  All other systems reviewed and are negative.   Objective:  Physical Exam  Constitutional: She is oriented to person, place, and time. She appears well-developed and well-nourished.  HENT:  Head: Normocephalic and atraumatic.  Eyes: Pupils are equal, round,  and reactive to light. EOM are normal.  Neck: Normal range of motion. Neck supple.  Cardiovascular: Normal rate and regular rhythm.  Respiratory: Effort normal and breath sounds normal.  GI: Soft. Bowel sounds are normal.  Musculoskeletal:       Right hip: She exhibits decreased range of motion, decreased strength, tenderness and bony tenderness.  Neurological: She is alert and oriented to person, place, and time.  Skin: Skin is warm and dry.  Psychiatric: She has a normal mood and affect.    Vital signs in last 24 hours: Temp:  [97.5 F (36.4 C)] 97.5 F  (36.4 C) (07/12 0621) Pulse Rate:  [55] 55 (07/12 0621) Resp:  [18] 18 (07/12 0621) BP: (149)/(58) 149/58 (07/12 0621) SpO2:  [96 %] 96 % (07/12 0621) Weight:  [159 lb (72.1 kg)] 159 lb (72.1 kg) (07/12 0637)  Labs:   Estimated body mass index is 25.28 kg/m as calculated from the following:   Height as of this encounter: 5' 6.5" (1.689 m).   Weight as of this encounter: 159 lb (72.1 kg).   Imaging Review Plain radiographs demonstrate severe degenerative joint disease of the right hip(s). The bone quality appears to be good for age and reported activity level.    Preoperative templating of the joint replacement has been completed, documented, and submitted to the Operating Room personnel in order to optimize intra-operative equipment management.     Assessment/Plan:  End stage arthritis, right hip(s)  The patient history, physical examination, clinical judgement of the provider and imaging studies are consistent with end stage degenerative joint disease of the right hip(s) and total hip arthroplasty is deemed medically necessary. The treatment options including medical management, injection therapy, arthroscopy and arthroplasty were discussed at length. The risks and benefits of total hip arthroplasty were presented and reviewed. The risks due to aseptic loosening, infection, stiffness, dislocation/subluxation,  thromboembolic complications and other imponderables were discussed.  The patient acknowledged the explanation, agreed to proceed with the plan and consent was signed. Patient is being admitted for inpatient treatment for surgery, pain control, PT, OT, prophylactic antibiotics, VTE prophylaxis, progressive ambulation and ADL's and discharge planning.The patient is planning to be discharged home with home health services

## 2017-10-06 NOTE — Anesthesia Postprocedure Evaluation (Signed)
Anesthesia Post Note  Patient: Chandra Asher Severino  Procedure(s) Performed: RIGHT TOTAL HIP ARTHROPLASTY ANTERIOR APPROACH (Right Hip)     Patient location during evaluation: PACU Anesthesia Type: Spinal and MAC Level of consciousness: awake and alert Pain management: pain level controlled Vital Signs Assessment: post-procedure vital signs reviewed and stable Respiratory status: spontaneous breathing, nonlabored ventilation, respiratory function stable and patient connected to nasal cannula oxygen Cardiovascular status: stable and blood pressure returned to baseline Postop Assessment: no apparent nausea or vomiting and spinal receding Anesthetic complications: no    Last Vitals:  Vitals:   10/06/17 1318 10/06/17 1419  BP: (!) 150/63 (!) 159/61  Pulse: (!) 54 (!) 58  Resp: 14 12  Temp: (!) 36.3 C (!) 36.4 C  SpO2: 100% 100%    Last Pain:  Vitals:   10/06/17 1419  TempSrc: Oral  PainSc:                  Teckla Christiansen

## 2017-10-06 NOTE — Op Note (Signed)
NAME: Kristi Wiley, Kristi Wiley MEDICAL RECORD WP:[id number] ACCOUNT [id number] DATE OF BIRTH:1940-11-30 FACILITY: [facility] LOCATION: [facility] PHYSICIAN:Zoa Dowty Kerry Fort, MD  OPERATIVE REPORT  DATE OF PROCEDURE:  10/06/2017  PREOPERATIVE DIAGNOSIS:  Severe end-stage arthritis and degenerative joint disease, right hip.  POSTOPERATIVE DIAGNOSES:  Severe end-stage arthritis and degenerative joint disease, right hip.  PROCEDURE:  Right total hip arthroplasty through direct anterior approach.  IMPLANTS:  DePuy Sector Gription acetabular component size 52, size 36+0 polyethylene liner, size 12 Corail femoral component with varus offset, size 36+1.5 metal head ball.  SURGEON:  Lind Guest. Ninfa Linden, MD  ASSISTANT:  Erskine Emery, PA-C.  ANESTHESIA:  Spinal.  ANTIBIOTICS:  Two grams IV Ancef.  ESTIMATED BLOOD LOSS:  150 mL.  COMPLICATIONS:  None.  INDICATIONS:  The patient is a very pleasant 77 year old female well known to me.  She actually has debilitating arthritis of both her hips.  X-rays show complete loss of joint space bilaterally.  She wished to proceed with a right total hip arthroplasty  at this point given the failure of conservative treatment.  Her right hip is the one that hurts her the most.  She has detrimentally affected her activities of daily living, her quality of life and mobility.  Her pain is 10/10, on occasion.  She has  tried assistive device.  She is a very thin individual and has tried anti-inflammatories as well.  This is starting to affect her posture as well as her back.  At this point, we have recommended total hip arthroplasty through direct anterior approach.   We talked to her in length about the risk of acute blood loss anemia, nerve or vessel injury, fracture, infection, dislocation, DVT.  She knows our goals are to decrease pain, improve mobility and overall improve quality of life.  DESCRIPTION OF PROCEDURE:  After informed consent was obtained for right  hip was marked.  She was brought to the operating room where spinal anesthesia was obtained while she was on a stretcher.  She was laid in the supine position on the stretcher.  I  assessed her leg lengths and found them to be equal.  A Foley catheter was placed and traction boots were placed on both her feet.  Next, she was placed supine on the Hana fracture table, perineal post in place and both legs in line skeletal traction  device and no traction applied.  Her right upper hip was prepped and draped with DuraPrep and sterile drapes.  A time-out was called to identify correct patient, correct right hip.  We then made an incision just inferior and posterior to the anterior  iliac spine and carried this obliquely down the leg.  We dissected down tensor fascia lata muscle.  Tensor fascia was then divided longitudinally to proceed with direct anterior approach to the hip.  We identified and cauterized circumflex vessels.  I  then identified the hip capsule, opened the hip capsule in a L-type format finding large joint effusion and significant periarticular osteophytes around the femoral head and neck.  We placed Cobra retractors around the medial and lateral femoral neck and  then made our femoral neck with an oscillating saw and completed this with an osteotome.  We placed a corkscrew guide in the femoral head and removed the femoral head in its entirety and found it to be devoid of cartilage.  We then placed a bent Hohmann  over the medial acetabular rim and removed remnants of acetabular labrum and other debris.  I then began reaming  in stepwise increments to a size 44 reamer all the way up to a size 51 with all reamers under direct visualization, the last reamer was  placed under direct fluoroscopy as well, so we could obtain our depth of reaming by inclination and anteversion.  Once I was pleased with this, I placed the real DePuy Sector Gription acetabular component size 52 and a 36+0 neutral  polyethylene liner for  that size acetabular component.  Attention was then turned to the femur.  With the leg externally rotated to 120 degrees, extended and adducted.  We placed a Mueller retractor medially and a Hohmann retractor behind the greater trochanter.  We used a  rongeur to lateralize and a box cutting osteotome to enter the femoral canal.  We then began broaching from a size 8 broach, going up to a size 12.  Based on her anatomy, we tried a varus offset femoral neck and we went with a 36-2 hip ball at first to  reduce this in the acetabulum.  Once we reduce it in the acetabulum, I felt like she just needed a little bit more offset leg length.  We were able to dislocate the hip and remove the trial components.  We then placed the real Corail femoral component  size 12 with varus offset and the real 36+1.5 metal hip ball reducing the acetabulum and I was pleased with leg length, offset, range of motion and stability and this was assessed under clinical and direct fluoroscopy.  We then irrigated the soft tissues  with normal saline solution and closed the joint capsule with interrupted #1 Ethibond suture.  We used a running 0 Vicryl and tensor fascia, 0 Vicryl in the deep tissue, 2-0 Vicryl subcutaneous tissue, 4-0 Monocryl subcuticular stitch and Steri-Strips  on the skin.  An Aquacel dressing was applied.  She was taken to recovery room in stable condition.  All final counts were correct.  There were no complications noted.  Note, Benita Stabile, PA-C, assisted in the entire case.  Assistance was crucial for  facilitating all aspects of this case.  TN/NUANCE  D:10/06/2017 T:10/06/2017 JOB:[id number]

## 2017-10-06 NOTE — Evaluation (Signed)
Physical Therapy Evaluation Patient Details Name: Kristi Wiley Vent MRN: [id number] DOB: 10-26-1940 Today's Date: 10/06/2017   History of Present Illness  Pt s/p R THR and with hx of breast CA with R mastectomy  Clinical Impression  Pt s/p R THR and presents with decreased R LE strength/ROM and post op pain limiting functional mobility.  Pt should progress to dc home with family assist.    Follow Up Recommendations Follow surgeon's recommendation for DC plan and follow-up therapies    Equipment Recommendations  None recommended by PT    Recommendations for Other Services       Precautions / Restrictions Precautions Precautions: Fall Restrictions Weight Bearing Restrictions: No Other Position/Activity Restrictions: WBAT      Mobility  Bed Mobility Overal bed mobility: Needs Assistance Bed Mobility: Supine to Sit     Supine to sit: Min assist;Mod assist     General bed mobility comments: cues for sequence and use of L LE to self assist  Transfers Overall transfer level: Needs assistance Equipment used: Rolling walker (2 wheeled) Transfers: Sit to/from Stand Sit to Stand: Min assist         General transfer comment: cues for LE management and use of UEs to self assist  Ambulation/Gait Ambulation/Gait assistance: Min assist Gait Distance (Feet): 70 Feet Assistive device: Rolling walker (2 wheeled) Gait Pattern/deviations: Step-to pattern;Decreased step length - right;Decreased step length - left;Shuffle;Trunk flexed Gait velocity: decr   General Gait Details: cues for sequence, posture and position from [employer]            Wheelchair Mobility    Modified Rankin (Stroke Patients Only)       Balance                                             Pertinent Vitals/Pain Pain Assessment: 0-10 Pain Score: 4  Pain Location: R hip Pain Descriptors / Indicators: Aching;Sore Pain Intervention(s): Limited activity within patient's  tolerance;Monitored during session;Premedicated before session;Ice applied    Home Living Family/patient expects to be discharged to:: Private residence Living Arrangements: Alone Available Help at Discharge: Family;Friend(s);Available 24 hours/day Type of Home: House Home Access: Stairs to enter   [employer] of Steps: 1 Home Layout: One level Home Equipment: Walker - 2 wheels;Bedside commode      Prior Function Level of Independence: Independent               Hand Dominance        Extremity/Trunk Assessment   Upper Extremity Assessment Upper Extremity Assessment: Overall WFL for tasks assessed    Lower Extremity Assessment Lower Extremity Assessment: RLE deficits/detail    Cervical / Trunk Assessment Cervical / Trunk Assessment: Normal  Communication   Communication: No difficulties  Cognition Arousal/Alertness: Awake/alert Behavior During Therapy: WFL for tasks assessed/performed Overall Cognitive Status: Within Functional Limits for tasks assessed                                        General Comments      Exercises Total Joint Exercises Ankle Circles/Pumps: AROM;Both;15 reps;Supine   Assessment/Plan    PT Assessment Patient needs continued PT services  PT Problem List Decreased strength;Decreased range of motion;Decreased activity tolerance;Decreased mobility;Decreased knowledge of use of DME;Pain  PT Treatment Interventions DME instruction;Gait training;Stair training;Functional mobility training;Therapeutic exercise;Therapeutic activities;Patient/family education    PT Goals (Current goals can be found in the Care Plan section)  Acute Rehab PT Goals Patient Stated Goal: Regain IND PT Goal Formulation: With patient Time For Goal Achievement: 10/20/17 Potential to Achieve Goals: Good    Frequency 7X/week   Barriers to discharge        Wiley-evaluation               AM-PAC PT "6 Clicks" Daily  Activity  Outcome Measure Difficulty turning over in bed (including adjusting bedclothes, sheets and blankets)?: Unable Difficulty moving from lying on back to sitting on the side of the bed? : Unable Difficulty sitting down on and standing up from a chair with arms (e.g., wheelchair, bedside commode, etc,.)?: Unable Help needed moving to and from a bed to chair (including a wheelchair)?: A Lot Help needed walking in hospital room?: A Little Help needed climbing 3-5 steps with a railing? : A Lot 6 Click Score: 10    End of Session Equipment Utilized During Treatment: Gait belt Activity Tolerance: Patient tolerated treatment well Patient left: in chair;with call bell/phone within reach;with family/visitor present Nurse Communication: Mobility status PT Visit Diagnosis: Difficulty in walking, not elsewhere classified (R26.2)    Time: 5409-8119 PT Time Calculation (min) (ACUTE ONLY): 34 min   Charges:   PT Evaluation $PT Eval Low Complexity: 1 Low PT Treatments $Gait Training: 8-22 mins   PT G Codes:        Pg [phone]   Shlonda Dolloff 10/06/2017, 5:45 PM

## 2017-10-06 NOTE — Progress Notes (Signed)
PHARMACIST - PHYSICIAN ORDER COMMUNICATION  CONCERNING: P&T Medication Policy on Herbal Medications  DESCRIPTION:  This patient's order for:  Biotin has been noted.  This product(s) is classified as an "herbal" or natural product. Due to a lack of definitive safety studies or FDA approval, nonstandard manufacturing practices, plus the potential risk of unknown drug-drug interactions while on inpatient medications, the Pharmacy and Therapeutics Committee does not permit the use of "herbal" or natural products of this type within [place].   ACTION TAKEN: The pharmacy department is unable to verify this order at this time.  Please reevaluate patient's clinical condition at discharge and address if the herbal or natural product(s) should be resumed at that time.   []  We informed your patient of this safety policy and the herbal therapy interruption while hospitalized.  Joelyn Lover Jamieson 

## 2017-10-07 LAB — CBC
HEMATOCRIT: 32 % — AB (ref 36.0–46.0)
HEMOGLOBIN: 10.9 g/dL — AB (ref 12.0–15.0)
MCH: 30.4 pg (ref 26.0–34.0)
MCHC: 34.1 g/dL (ref 30.0–36.0)
MCV: 89.4 fL (ref 78.0–100.0)
Platelets: 188 10*3/uL (ref 150–400)
RBC: 3.58 MIL/uL — AB (ref 3.87–5.11)
RDW: 13.1 % (ref 11.5–15.5)
WBC: 12.7 10*3/uL — ABNORMAL HIGH (ref 4.0–10.5)

## 2017-10-07 LAB — BASIC METABOLIC PANEL
Anion gap: 5 (ref 5–15)
BUN: 19 mg/dL (ref 8–23)
CHLORIDE: 108 mmol/L (ref 98–111)
CO2: 28 mmol/L (ref 22–32)
Calcium: 8.7 mg/dL — ABNORMAL LOW (ref 8.9–10.3)
Creatinine, Ser: 0.72 mg/dL (ref 0.44–1.00)
GFR calc Af Amer: >60 mL/min (ref >60)
GFR calc non Af Amer: >60 mL/min (ref >60)
GLUCOSE: 136 mg/dL — AB (ref 70–99)
POTASSIUM: 4.2 mmol/L (ref 3.5–5.1)
SODIUM: 141 mmol/L (ref 135–145)

## 2017-10-07 MED ORDER — HYDROCODONE-ACETAMINOPHEN 5-325 MG PO TABS: 1.0000 | ORAL_TABLET | ORAL | 0 refills | Status: DC | PRN

## 2017-10-07 MED ORDER — ASPIRIN 81 MG PO CHEW
81.0000 mg | CHEWABLE_TABLET | Freq: Two times a day (BID) | ORAL | 0 refills | Status: DC
Start: 2017-10-07 — End: 2019-08-20

## 2017-10-07 MED ORDER — METHOCARBAMOL 500 MG PO TABS: 500.0000 mg | ORAL_TABLET | Freq: Four times a day (QID) | ORAL | 0 refills | Status: DC | PRN

## 2017-10-07 NOTE — Care Management Note (Signed)
Case Management Note  Patient Details  Name: Kristi Wiley MRN: [id number] Date of Birth: [birth year]  Subjective/Objective:  77 yo F s/p R THR                Action/Plan: [facility]   Expected Discharge Date:    10/08/17              Expected Discharge Plan:  [place]  In-House Referral:     Discharge planning Services  CM Consult  Post Acute Care Choice:  Home Health Choice offered to:  Patient, Adult Children  DME Arranged:    DME Agency:     HH Arranged:  PT [place]:  [facility]  Status of Service:  Completed, signed off  If discussed at [employer], dates discussed:    Additional Comments: Met with pt and daughter. Pt plans to return home with the support of her daughter. She has a RW, 3-in-1 BSC and a tub bench. Discussed HHPT preference. Referral made to [facility] prior to surgery and they agreed with agency.  Norina Buzzard, RN 10/07/2017, 11:10 AM

## 2017-10-07 NOTE — Progress Notes (Signed)
Physical Therapy Treatment Patient Details Name: Kristi Wiley MRN: [id number] DOB: 05-19-1940 Today's Date: 10/07/2017    History of Present Illness Pt s/p R THR and with hx of breast CA with R mastectomy    PT Comments    Pt progressing well with mobility.  Family present to review stairs and car transfers.   Follow Up Recommendations  Follow surgeon's recommendation for DC plan and follow-up therapies     Equipment Recommendations  None recommended by PT    Recommendations for Other Services       Precautions / Restrictions Precautions Precautions: Fall Restrictions Weight Bearing Restrictions: No Other Position/Activity Restrictions: WBAT    Mobility  Bed Mobility Overal bed mobility: Needs Assistance Bed Mobility: Sit to Supine       Sit to supine: Min guard   General bed mobility comments: cues for sequence and use of L LE to self assist  Transfers Overall transfer level: Needs assistance Equipment used: Rolling walker (2 wheeled) Transfers: Sit to/from Stand Sit to Stand: Min guard;Supervision         General transfer comment: cues for LE management and use of UEs to self assist  Ambulation/Gait Ambulation/Gait assistance: Min guard;Supervision Gait Distance (Feet): 400 Feet(and to/from bathroom) Assistive device: Rolling walker (2 wheeled) Gait Pattern/deviations: Step-to pattern;Step-through pattern;Decreased step length - right;Decreased step length - left;Shuffle;Trunk flexed Gait velocity: decr   General Gait Details: min cues for posture, position from RW and initial sequence   Stairs Stairs: Yes Stairs assistance: Min assist Stair Management: No rails;Step to pattern;Forwards;With walker Number of Stairs: 2 General stair comments: single step twice with cues for foot/RW placement and sequence   Wheelchair Mobility    Modified Rankin (Stroke Patients Only)       Balance                                             Cognition Arousal/Alertness: Awake/alert Behavior During Therapy: WFL for tasks assessed/performed Overall Cognitive Status: Within Functional Limits for tasks assessed                                        Exercises Total Joint Exercises Ankle Circles/Pumps: AROM;Both;15 reps;Supine Quad Sets: AROM;Both;10 reps;Supine Heel Slides: AAROM;Right;20 reps;Supine Hip ABduction/ADduction: AAROM;Right;15 reps;Supine    General Comments        Pertinent Vitals/Pain Pain Assessment: 0-10 Pain Score: 3  Pain Location: R hip Pain Descriptors / Indicators: Aching;Sore Pain Intervention(s): Limited activity within patient's tolerance;Monitored during session;Premedicated before session;Ice applied    Home Living Family/patient expects to be discharged to:: Private residence Living Arrangements: Alone Available Help at Discharge: Family;Friend(s);Available 24 hours/day Type of Home: House Home Access: Stairs to enter   Home Layout: One level Home Equipment: Environmental consultant - 2 wheels;Bedside commode;Shower seat      Prior Function Level of Independence: Independent          PT Goals (current goals can now be found in the care plan section) Acute Rehab PT Goals Patient Stated Goal: Regain IND PT Goal Formulation: With patient Time For Goal Achievement: 10/20/17 Potential to Achieve Goals: Good Progress towards PT goals: Progressing toward goals    Frequency    7X/week      PT Plan Current plan remains appropriate  Co-evaluation              AM-PAC PT "6 Clicks" Daily Activity  Outcome Measure  Difficulty turning over in bed (including adjusting bedclothes, sheets and blankets)?: A Lot Difficulty moving from lying on back to sitting on the side of the bed? : A Lot Difficulty sitting down on and standing up from a chair with arms (e.g., wheelchair, bedside commode, etc,.)?: A Little Help needed moving to and from a bed to chair (including a  wheelchair)?: A Little Help needed walking in hospital room?: A Little Help needed climbing 3-5 steps with a railing? : A Little 6 Click Score: 16    End of Session Equipment Utilized During Treatment: Gait belt Activity Tolerance: Patient tolerated treatment well Patient left: in bed;with call bell/phone within reach;with family/visitor present Nurse Communication: Mobility status PT Visit Diagnosis: Difficulty in walking, not elsewhere classified (R26.2)     Time: 8657-8469 PT Time Calculation (min) (ACUTE ONLY): 23 min  Charges:  $Gait Training: 8-22 mins $Therapeutic Exercise: 8-22 mins $Therapeutic Activity: 8-22 mins                    G Codes:       GE [phone]    Paulita Licklider 10/07/2017, 3:35 PM

## 2017-10-07 NOTE — Progress Notes (Signed)
CSW consult-SNF Plan: Home CSW signing off.   Kathrin Greathouse, Marlinda Mike, MSW Clinical Social Worker  [phone] 10/07/2017  9:24 AM

## 2017-10-07 NOTE — Progress Notes (Signed)
Occupational Therapy Evaluation Patient Details Name: Kristi Wiley MRN: [id number] DOB: 07/23/1940 Today's Date: 10/07/2017    History of Present Illness Pt s/p R THR and with hx of breast CA with R mastectomy   Clinical Impression   Patient presents to OT with decreased ADL independence and safety due to the deficits listed below. She will benefit from skilled OT to maximize function and to facilitate a safe discharge. OT will follow.    Follow Up Recommendations  No OT follow up    Equipment Recommendations  None recommended by OT    Recommendations for Other Services       Precautions / Restrictions Precautions Precautions: Fall Restrictions Weight Bearing Restrictions: No Other Position/Activity Restrictions: WBAT      Mobility Bed Mobility               General bed mobility comments: Up in recliner  Transfers Overall transfer level: Needs assistance Equipment used: Rolling walker (2 wheeled) Transfers: Sit to/from Stand Sit to Stand: Min guard         General transfer comment: cues for LE management and use of UEs to self assist    Balance                                           ADL either performed or assessed with clinical judgement   ADL Overall ADL's : Needs assistance/impaired Eating/Feeding: Independent   Grooming: Set up;Sitting   Upper Body Bathing: Set up;Sitting   Lower Body Bathing: Minimal assistance;Sit to/from stand   Upper Body Dressing : Set up;Sitting   Lower Body Dressing: Minimal assistance;Sit to/from stand   Toilet Transfer: Min guard;Ambulation;BSC;RW   Toileting- Water quality scientist and Hygiene: Min guard;Sit to/from stand   Tub/ Shower Transfer: Walk-in shower;Min guard;Anterior/posterior;Rolling walker   Functional mobility during ADLs: Min guard;Rolling walker       Vision         Perception     Praxis      Pertinent Vitals/Pain Pain Assessment: 0-10 Pain Score: 3  Pain  Location: R hip Pain Descriptors / Indicators: Aching;Sore Pain Intervention(s): Limited activity within patient's tolerance;Monitored during session;Ice applied     Hand Dominance     Extremity/Trunk Assessment Upper Extremity Assessment Upper Extremity Assessment: Overall WFL for tasks assessed   Lower Extremity Assessment Lower Extremity Assessment: Defer to PT evaluation       Communication Communication Communication: No difficulties   Cognition Arousal/Alertness: Awake/alert Behavior During Therapy: WFL for tasks assessed/performed Overall Cognitive Status: Within Functional Limits for tasks assessed                                     General Comments       Exercises Exercises: Total Joint Total Joint Exercises Ankle Circles/Pumps: AROM;Both;15 reps;Supine Quad Sets: AROM;Both;10 reps;Supine Heel Slides: AAROM;Right;20 reps;Supine Hip ABduction/ADduction: AAROM;Right;15 reps;Supine   Shoulder Instructions      Home Living Family/patient expects to be discharged to:: Private residence Living Arrangements: Alone Available Help at Discharge: Family;Friend(s);Available 24 hours/day Type of Home: House Home Access: Stairs to enter [employer] of Steps: 1   Home Layout: One level     Bathroom Shower/Tub: Occupational psychologist: Standard Bathroom Accessibility: Yes How Accessible: Accessible via walker Home Equipment: [place] - 2 wheels;Bedside  commode;Shower seat          Prior Functioning/Environment Level of Independence: Independent                 OT Problem List: Decreased strength;Decreased activity tolerance;Decreased knowledge of use of DME or AE;Pain      OT Treatment/Interventions: Self-care/ADL training;DME and/or AE instruction;Therapeutic activities;Patient/family education    OT Goals(Current goals can be found in the care plan section) Acute Rehab OT Goals Patient Stated Goal: Regain IND OT  Goal Formulation: With patient Time For Goal Achievement: 10/21/17 Potential to Achieve Goals: Good  OT Frequency: Min 2X/week   Barriers to D/C:            Co-evaluation              AM-PAC PT "6 Clicks" Daily Activity     Outcome Measure Help from another person eating meals?: None Help from another person taking care of personal grooming?: A Little Help from another person toileting, which includes using toliet, bedpan, or urinal?: A Little Help from another person bathing (including washing, rinsing, drying)?: A Little Help from another person to put on and taking off regular upper body clothing?: None Help from another person to put on and taking off regular lower body clothing?: A Little 6 Click Score: 20   End of Session Equipment Utilized During Treatment: Rolling walker Nurse Communication: Mobility status  Activity Tolerance: Patient tolerated treatment well Patient left: in chair;with call bell/phone within reach;with family/visitor present  OT Visit Diagnosis: Other abnormalities of gait and mobility (R26.89)                Time: 4696-2952 OT Time Calculation (min): 33 min Charges:  OT General Charges $OT Visit: 1 Visit OT Evaluation $OT Eval Low Complexity: 1 Low OT Treatments $Self Care/Home Management : 8-22 mins G-Codes:       Kristi Wiley 01-Nov-2017, 2:24 PM

## 2017-10-07 NOTE — Progress Notes (Signed)
Subjective: 1 Day Post-Op Procedure(s) (LRB): RIGHT TOTAL HIP ARTHROPLASTY ANTERIOR APPROACH (Right) Patient reports pain as moderate.  Slight light-headed when up this am.  Objective: Vital signs in last 24 hours: Temp:  [97.3 F (36.3 C)-98.8 F (37.1 C)] 98.8 F (37.1 C) (07/13 1020) Pulse Rate:  [45-62] 57 (07/13 1020) Resp:  [12-18] 13 (07/13 1020) BP: (135-183)/(49-71) 135/49 (07/13 1020) SpO2:  [96 %-100 %] 96 % (07/13 1020)  Intake/Output from previous day: 07/12 0701 - 07/13 0700 In: 4088.8 [P.O.:960; I.V.:2973.8; IV Piggyback:155] Out: 2275 [Urine:2125; Blood:150] Intake/Output this shift: No intake/output data recorded.  Recent Labs    10/07/17 0501  HGB 10.9*   Recent Labs    10/07/17 0501  WBC 12.7*  RBC 3.58*  HCT 32.0*  PLT 188   Recent Labs    10/07/17 0501  NA 141  K 4.2  CL 108  CO2 28  BUN 19  CREATININE 0.72  GLUCOSE 136*  CALCIUM 8.7*   No results for input(s): LABPT, INR in the last 72 hours.  Sensation intact distally Intact pulses distally Dorsiflexion/Plantar flexion intact Incision: dressing C/D/I  Assessment/Plan: 1 Day Post-Op Procedure(s) (LRB): RIGHT TOTAL HIP ARTHROPLASTY ANTERIOR APPROACH (Right) Up with therapy Plan for discharge tomorrow Discharge home with home health    Mcarthur Rossetti 10/07/2017, 11:02 AM

## 2017-10-07 NOTE — Progress Notes (Signed)
Physical Therapy Treatment Patient Details Name: Kristi Wiley MRN: [id number] DOB: Aug 08, 1940 Today's Date: 10/07/2017    History of Present Illness Pt s/p R THR and with hx of breast CA with R mastectomy    PT Comments    Pt in good spirits and progressing well with mobility.  Follow Up Recommendations  Follow surgeon's recommendation for DC plan and follow-up therapies     Equipment Recommendations  None recommended by PT    Recommendations for Other Services       Precautions / Restrictions Precautions Precautions: Fall Restrictions Weight Bearing Restrictions: No Other Position/Activity Restrictions: WBAT    Mobility  Bed Mobility                  Transfers Overall transfer level: Needs assistance Equipment used: Rolling walker (2 wheeled) Transfers: Sit to/from Stand Sit to Stand: Min guard         General transfer comment: cues for LE management and use of UEs to self assist  Ambulation/Gait Ambulation/Gait assistance: Min assist;Min guard Gait Distance (Feet): 200 Feet Assistive device: Rolling walker (2 wheeled) Gait Pattern/deviations: Decreased step length - right;Decreased step length - left;Shuffle;Trunk flexed;Step-to pattern;Step-through pattern Gait velocity: decr   General Gait Details: cues for sequence, posture and position from [employer] Mobility    Modified Rankin (Stroke Patients Only)       Balance                                            Cognition Arousal/Alertness: Awake/alert Behavior During Therapy: WFL for tasks assessed/performed Overall Cognitive Status: Within Functional Limits for tasks assessed                                        Exercises Total Joint Exercises Ankle Circles/Pumps: AROM;Both;15 reps;Supine Quad Sets: AROM;Both;10 reps;Supine Heel Slides: AAROM;Right;20 reps;Supine Hip ABduction/ADduction: AAROM;Right;15  reps;Supine    General Comments        Pertinent Vitals/Pain Pain Assessment: 0-10 Pain Score: 4  Pain Location: R hip Pain Descriptors / Indicators: Aching;Sore Pain Intervention(s): Limited activity within patient's tolerance;Monitored during session;Premedicated before session;Ice applied    Home Living                      Prior Function            PT Goals (current goals can now be found in the care plan section) Acute Rehab PT Goals Patient Stated Goal: Regain IND PT Goal Formulation: With patient Time For Goal Achievement: 10/20/17 Potential to Achieve Goals: Good Progress towards PT goals: Progressing toward goals    Frequency    7X/week      PT Plan Current plan remains appropriate    Co-evaluation              AM-PAC PT "6 Clicks" Daily Activity  Outcome Measure  Difficulty turning over in bed (including adjusting bedclothes, sheets and blankets)?: Unable Difficulty moving from lying on back to sitting on the side of the bed? : Unable Difficulty sitting down on and standing up from a chair with arms (e.g., wheelchair, bedside commode, etc,.)?: Unable Help needed moving to and from a bed  to chair (including a wheelchair)?: A Lot Help needed walking in hospital room?: A Little Help needed climbing 3-5 steps with a railing? : A Lot 6 Click Score: 10    End of Session Equipment Utilized During Treatment: Gait belt Activity Tolerance: Patient tolerated treatment well Patient left: in chair;with call bell/phone within reach;with family/visitor present Nurse Communication: Mobility status PT Visit Diagnosis: Difficulty in walking, not elsewhere classified (R26.2)     Time: 5053-9767 PT Time Calculation (min) (ACUTE ONLY): 29 min  Charges:  $Gait Training: 8-22 mins $Therapeutic Exercise: 8-22 mins                    G Codes:       Pg [phone]    Mammie Meras 10/07/2017, 12:39 PM

## 2017-10-08 NOTE — Progress Notes (Signed)
Subjective: 2 Days Post-Op Procedure(s) (LRB): RIGHT TOTAL HIP ARTHROPLASTY ANTERIOR APPROACH (Right) Patient reports pain as mild and moderate.    Objective: Vital signs in last 24 hours: Temp:  [98.3 F (36.8 C)-99.6 F (37.6 C)] 98.3 F (36.8 C) (07/14 0534) Pulse Rate:  [52-65] 58 (07/14 0534) Resp:  [13-16] 16 (07/14 0534) BP: (122-149)/(48-60) 146/56 (07/14 0534) SpO2:  [97 %-98 %] 98 % (07/14 0534)  Intake/Output from previous day: 07/13 0701 - 07/14 0700 In: 2296.3 [P.O.:1440; I.V.:856.3] Out: 450 [Urine:450] Intake/Output this shift: Total I/O In: 120 [P.O.:120] Out: 650 [Urine:650]  Recent Labs    10/07/17 0501  HGB 10.9*   Recent Labs    10/07/17 0501  WBC 12.7*  RBC 3.58*  HCT 32.0*  PLT 188   Recent Labs    10/07/17 0501  NA 141  K 4.2  CL 108  CO2 28  BUN 19  CREATININE 0.72  GLUCOSE 136*  CALCIUM 8.7*   No results for input(s): LABPT, INR in the last 72 hours.  Neurovascular intact Sensation intact distally Dorsiflexion/Plantar flexion intact Incision: dressing C/D/I Compartment soft  Anticipated LOS equal to or greater than 2 midnights due to - Age 4 and older with one or more of the following:  - Obesity  - Expected need for hospital services (PT, OT, Nursing) required for safe  discharge  - Anticipated need for postoperative skilled nursing care or inpatient rehab  - Active co-morbidities: None OR   - Unanticipated findings during/Post Surgery: None  - Patient is a high risk of re-admission due to: None   Assessment/Plan: 2 Days Post-Op Procedure(s) (LRB): RIGHT TOTAL HIP ARTHROPLASTY ANTERIOR APPROACH (Right) Advance diet Up with therapy Discharge home with home health    Kristi Wiley 10/08/2017, 10:24 AM

## 2017-10-08 NOTE — Progress Notes (Signed)
OT Cancellation Note  Patient Details Name: Natassja Ollis Guardado MRN: [id number] DOB: [birth year]   Cancelled Treatment:    Reason Eval/Treat Not Completed: Other (comment) Pt states she feels comfortable with performing ADL and reports she has been up to 3in1 without difficulty several times with staff. Daughter present and also states no concerns with helping with ADL PRN. Pt declined need for OT session this am.  Philippa Chester 10/08/2017, 10:34 AM

## 2017-10-08 NOTE — Progress Notes (Signed)
Discharged from floor via w/c for transport home by car. Belongings & family with pt. No changes in assessment. Kristi Wiley  

## 2017-10-08 NOTE — Progress Notes (Signed)
Physical Therapy Treatment Patient Details Name: Kristi Wiley MRN: [id number] DOB: 1940-05-09 Today's Date: 10/08/2017    History of Present Illness Pt s/p R THR and with hx of breast CA with R mastectomy    PT Comments    Pt initially declining participation with PT but then became agreeable after daughter encouraged her. Reviewed exercises and gait training. Pt comfortable with stair negotiation from practice on yesterday. Plan is for HHPT f/u. Education completed. Okay to d/c from PT standpoint-made RN aware.    Follow Up Recommendations  Follow surgeon's recommendation for DC plan and follow-up therapies     Equipment Recommendations  None recommended by PT    Recommendations for Other Services       Precautions / Restrictions Precautions Precautions: Fall Restrictions Weight Bearing Restrictions: No Other Position/Activity Restrictions: WBAT    Mobility  Bed Mobility               General bed mobility comments: oob in recliner  Transfers Overall transfer level: Needs assistance Equipment used: Rolling walker (2 wheeled) Transfers: Sit to/from Stand Sit to Stand: Supervision         General transfer comment: for safety.   Ambulation/Gait Ambulation/Gait assistance: Supervision Gait Distance (Feet): 125 Feet Assistive device: Rolling walker (2 wheeled) Gait Pattern/deviations: Step-through pattern;Decreased step length - right;Decreased step length - left;Decreased stride length     General Gait Details: for safety. slow gait speed. Pt c/o tightness, burning.    Stairs             Wheelchair Mobility    Modified Rankin (Stroke Patients Only)       Balance                                            Cognition Arousal/Alertness: Awake/alert Behavior During Therapy: WFL for tasks assessed/performed Overall Cognitive Status: Within Functional Limits for tasks assessed                                         Exercises Total Joint Exercises Hip ABduction/ADduction: AROM;Right;10 reps;Standing Long Arc Quad: AROM;Right;10 reps;Seated Marching in Standing: AROM;Both;10 reps;Standing General Exercises - Lower Extremity Heel Raises: AROM;Both;10 reps;Standing    General Comments        Pertinent Vitals/Pain Pain Assessment: 0-10 Pain Score: 5  Pain Location: R hip/thigh Pain Descriptors / Indicators: Aching;Sore Pain Intervention(s): Monitored during session;Repositioned;Ice applied    Home Living                      Prior Function            PT Goals (current goals can now be found in the care plan section) Progress towards PT goals: Progressing toward goals    Frequency    7X/week      PT Plan Current plan remains appropriate    Co-evaluation              AM-PAC PT "6 Clicks" Daily Activity  Outcome Measure  Difficulty turning over in bed (including adjusting bedclothes, sheets and blankets)?: A Lot Difficulty moving from lying on back to sitting on the side of the bed? : A Lot Difficulty sitting down on and standing up from a chair with arms (e.g., wheelchair, bedside commode,  etc,.)?: A Little Help needed moving to and from a bed to chair (including a wheelchair)?: A Little Help needed walking in hospital room?: A Little Help needed climbing 3-5 steps with a railing? : A Little 6 Click Score: 16    End of Session   Activity Tolerance: Patient tolerated treatment well Patient left: in chair;with call bell/phone within reach;with family/visitor present   PT Visit Diagnosis: Difficulty in walking, not elsewhere classified (R26.2)     Time: 9924-2683 PT Time Calculation (min) (ACUTE ONLY): 10 min  Charges:  $Gait Training: 8-22 mins                    G Codes:          Weston Anna, MPT Pager: [phone]

## 2017-10-08 NOTE — Discharge Instructions (Signed)
MAKE AN APPOINTMENT to follow up with DR [facility] for in two weeks from surgery.                [place]               [phone]  INSTRUCTIONS AFTER JOINT REPLACEMENT   o Remove items at home which could result in a fall. This includes throw rugs or furniture in walking pathways o ICE to the affected joint every three hours while awake for 30 minutes at a time, for at least the first 3-5 days, and then as needed for pain and swelling.  Continue to use ice for pain and swelling. You may notice swelling that will progress down to the foot and ankle.  This is normal after surgery.  Elevate your leg when you are not up walking on it.   o Continue to use the breathing machine you got in the hospital (incentive spirometer) which will help keep your temperature down.  It is common for your temperature to cycle up and down following surgery, especially at night when you are not up moving around and exerting yourself.  The breathing machine keeps your lungs expanded and your temperature down.   DIET:  As you were doing prior to hospitalization, we recommend a well-balanced diet.  DRESSING / WOUND CARE / SHOWERING  Keep the surgical dressing until follow up.  The dressing is water proof, so you can shower without any extra covering.  IF THE DRESSING FALLS OFF or the wound gets wet inside, change the dressing with sterile gauze.  Please use good hand washing techniques before changing the dressing.  Do not use any lotions or creams on the incision until instructed by your surgeon.    ACTIVITY  o Increase activity slowly as tolerated, but follow the weight bearing instructions below.   o No driving for 6 weeks or until further direction given by your physician.  You cannot drive while taking narcotics.  o No lifting or carrying greater than 10 lbs. until further directed by your surgeon. o Avoid periods of inactivity such as  sitting longer than an hour when not asleep. This helps prevent blood clots.  o You may return to work once you are authorized by your doctor.     WEIGHT BEARING   Weight bearing as tolerated with assist device (walker, cane, etc) as directed, use it as long as suggested by your surgeon or therapist, typically at least 4-6 weeks.   EXERCISES  Results after joint replacement surgery are often greatly improved when you follow the exercise, range of motion and muscle strengthening exercises prescribed by your doctor. Safety measures are also important to protect the joint from further injury. Any time any of these exercises cause you to have increased pain or swelling, decrease what you are doing until you are comfortable again and then slowly increase them. If you have problems or questions, call your caregiver or physical therapist for advice.   Rehabilitation is important following a joint replacement. After just a few days of immobilization, the muscles of the leg can become weakened and shrink (atrophy).  These exercises are designed to build up the tone and strength of the thigh and leg muscles and to improve motion. Often times heat used for twenty to  thirty minutes before working out will loosen up your tissues and help with improving the range of motion but do not use heat for the first two weeks following surgery (sometimes heat can increase post-operative swelling).   These exercises can be done on a training (exercise) mat, on the floor, on a table or on a bed. Use whatever works the best and is most comfortable for you.    Use music or television while you are exercising so that the exercises are a pleasant break in your day. This will make your life better with the exercises acting as a break in your routine that you can look forward to.   Perform all exercises about fifteen times, three times per day or as directed.  You should exercise both the operative leg and the other leg as  well.  Exercises include:    Quad Sets - Tighten up the muscle on the front of the thigh (Quad) and hold for 5-10 seconds.    Straight Leg Raises - With your knee straight (if you were given a brace, keep it on), lift the leg to 60 degrees, hold for 3 seconds, and slowly lower the leg.  Perform this exercise against resistance later as your leg gets stronger.   Leg Slides: Lying on your back, slowly slide your foot toward your buttocks, bending your knee up off the floor (only go as far as is comfortable). Then slowly slide your foot back down until your leg is flat on the floor again.   Angel Wings: Lying on your back spread your legs to the side as far apart as you can without causing discomfort.   Hamstring Strength:  Lying on your back, push your heel against the floor with your leg straight by tightening up the muscles of your buttocks.  Repeat, but this time bend your knee to a comfortable angle, and push your heel against the floor.  You may put a pillow under the heel to make it more comfortable if necessary.   A rehabilitation program following joint replacement surgery can speed recovery and prevent re-injury in the future due to weakened muscles. Contact your doctor or a physical therapist for more information on knee rehabilitation.    CONSTIPATION  Constipation is defined medically as fewer than three stools per week and severe constipation as less than one stool per week.  Even if you have a regular bowel pattern at home, your normal regimen is likely to be disrupted due to multiple reasons following surgery.  Combination of anesthesia, postoperative narcotics, change in appetite and fluid intake all can affect your bowels.   YOU MUST use at least one of the following options; they are listed in order of increasing strength to get the job done.  They are all available over the counter, and you may need to use some, POSSIBLY even all of these options:    Drink plenty of fluids  (prune juice may be helpful) and high fiber foods Colace 100 mg by mouth twice a day  Senokot for constipation as directed and as needed Dulcolax (bisacodyl), take with full glass of water  Miralax (polyethylene glycol) once or twice a day as needed.  If you have tried all these things and are unable to have a bowel movement in the first 3-4 days after surgery call either your surgeon or your primary doctor.    If you experience loose stools or diarrhea, hold the medications until you stool forms back up.  If your  symptoms do not get better within 1 week or if they get worse, check with your doctor.  If you experience "the worst abdominal pain ever" or develop nausea or vomiting, please contact the office immediately for further recommendations for treatment.   ITCHING:  If you experience itching with your medications, try taking only a single pain pill, or even half a pain pill at a time.  You can also use Benadryl over the counter for itching or also to help with sleep.   TED HOSE STOCKINGS:  Use stockings on both legs until for at least 2 weeks or as directed by physician office. They may be removed at night for sleeping.  MEDICATIONS:  Malecki your medication summary on the After Visit Summary that nursing will review with you.  You may have some home medications which will be placed on hold until you complete the course of blood thinner medication.  It is important for you to complete the blood thinner medication as prescribed.  PRECAUTIONS:  If you experience chest pain or shortness of breath - call 911 immediately for transfer to the hospital emergency department.   If you develop a fever greater that 101 F, purulent drainage from wound, increased redness or drainage from wound, foul odor from the wound/dressing, or calf pain - CONTACT YOUR SURGEON.                                                   FOLLOW-UP APPOINTMENTS:  If you do not already have a post-op appointment, please call the  office for an appointment to be seen by your surgeon.  Guidelines for how soon to be seen are listed in your After Visit Summary, but are typically between 1-4 weeks after surgery.  OTHER INSTRUCTIONS:   Knee Replacement:  Do not place pillow under knee, focus on keeping the knee straight while resting. CPM instructions: 0-90 degrees, 2 hours in the morning, 2 hours in the afternoon, and 2 hours in the evening. Place foam block, curve side up under heel at all times except when in CPM or when walking.  DO NOT modify, tear, cut, or change the foam block in any way.  MAKE SURE YOU:   Understand these instructions.   Get help right away if you are not doing well or get worse.    Thank you for letting us be a part of your medical care team.  It is a privilege we respect greatly.  We hope these instructions will help you stay on track for a fast and full recovery!

## 2017-10-09 ENCOUNTER — Telehealth (INDEPENDENT_AMBULATORY_CARE_PROVIDER_SITE_OTHER): Payer: Self-pay | Admitting: Orthopaedic Surgery

## 2017-10-09 NOTE — Telephone Encounter (Signed)
Allison-(PT) with [facility] needing verbal orders for HHPT  3 wk 2. The  Number to contact Ebony Hail is [phone]

## 2017-10-09 NOTE — Telephone Encounter (Signed)
I called and advised verbal ok for physical therapy orders

## 2017-10-17 NOTE — Discharge Summary (Signed)
Joni Fears, MD   Biagio Borg, PA-C [address] [place], [place]  [address]                             [phone]  PATIENT ID: Sharla Tankard Bidwell        MRN:  [id number]          DOB/AGE: [birth year] / 78 y.o.    DISCHARGE SUMMARY  ADMISSION DATE:    10/06/2017 DISCHARGE DATE:   10/08/2017   ADMISSION DIAGNOSIS: osteoarthritis right hip    DISCHARGE DIAGNOSIS:  osteoarthritis right hip    ADDITIONAL DIAGNOSIS: Principal Problem:   Unilateral primary osteoarthritis, right hip Active Problems:   Status post total replacement of right hip  Past Medical History:  Diagnosis Date  . Arthritis    oa of right hip , left hip , left thumb   . Breast cancer ([place])   . Elevated cholesterol   . Hypertension   . Mitral valve disorder    sicne birth   . Tinnitus     PROCEDURE: Procedure(s): RIGHT TOTAL HIP ARTHROPLASTY ANTERIOR APPROACH  on 10/06/2017  CONSULTS: none    HISTORY:  Jaide Hillenburg Mccain, 77 y.o. female, has a history of pain and functional disability in the right hip(s) due to arthritis and patient has failed non-surgical conservative treatments for greater than 12 weeks to include NSAID's and/or analgesics, corticosteriod injections, flexibility and strengthening excercises and activity modification.  Onset of symptoms was gradual starting 3 years ago with gradually worsening course since that time.The patient noted no past surgery on the right hip(s).  Patient currently rates pain in the right hip at 10 out of 10 with activity. Patient has night pain, worsening of pain with activity and weight bearing, trendelenberg gait, pain that interfers with activities of daily living and pain with passive range of motion. Patient has evidence of subchondral cysts, subchondral sclerosis, periarticular osteophytes and joint space narrowing by imaging studies. This condition presents safety issues increasing the risk of falls.  There is no current active infection.     HOSPITAL COURSE:   Tyliyah Mcmeekin Ennis is a 77 y.o. admitted on 10/06/2017 and found to have a diagnosis of osteoarthritis right hip.  After appropriate laboratory studies were obtained  they were taken to the operating room on 10/06/2017 and underwent  Procedure(s): RIGHT TOTAL HIP ARTHROPLASTY ANTERIOR APPROACH  .   They were given perioperative antibiotics:  Anti-infectives (From admission, onward)   Start     Dose/Rate Route Frequency Ordered Stop   10/06/17 1500  ceFAZolin (ANCEF) IVPB 1 g/50 mL premix     1 g 100 mL/hr over 30 Minutes Intravenous Every 6 hours 10/06/17 1220 10/06/17 2111   10/06/17 0630  ceFAZolin (ANCEF) IVPB 2g/100 mL premix     2 g 200 mL/hr over 30 Minutes Intravenous On call to O.R. 10/06/17 2376 10/06/17 0847    .  Tolerated the procedure well.    POD #1, allowed out of bed to a chair.  PT for ambulation and exercise program.    IV saline locked.  Marland Kitchen  POD #2, continued PT and ambulation.    The remainder of the hospital course was dedicated to ambulation and strengthening.   The patient was discharged on 2 Days Post-Op in  Stable condition.  Blood products given:none  DIAGNOSTIC STUDIES: Recent vital signs: No data found.     Recent laboratory studies: No results for input(s):  WBC, HGB, HCT, PLT in the last 168 hours. No results for input(s): NA, K, CL, CO2, BUN, CREATININE, GLUCOSE, CALCIUM in the last 168 hours. No results found for: INR, PROTIME   Recent Radiographic Studies :  Dg Pelvis 1-2 Views  Result Date: 10/06/2017 CLINICAL DATA:  Post-op pacu film. New right anterior hip replacement. EXAM: PELVIS - 1-2 VIEW COMPARISON:  None. FINDINGS: Status post RIGHT hip arthroplasty. Hardware appears intact and appropriately positioned. Osseous alignment is anatomic. Expected postsurgical changes within the overlying soft tissues. IMPRESSION: Status post RIGHT hip arthroplasty. No evidence of surgical complicating feature. Electronically Signed   By: Franki Cabot M.D.   On:  10/06/2017 11:15   Dg C-arm 1-60 Min-no Report  Result Date: 10/06/2017 Fluoroscopy was utilized by the requesting physician.  No radiographic interpretation.    DISCHARGE INSTRUCTIONS: Discharge Instructions    Diet general   Complete by:  As directed    Increase activity slowly   Complete by:  As directed       DISCHARGE MEDICATIONS:   Allergies as of 10/08/2017      Reactions   Nitrofurantoin Other ([place] Comments)   turned red in face   Sulfonamide Derivatives Other ([place] Comments)   Face, ears chest turned real red   Latex Rash   " swelling"       Medication List    STOP taking these medications   aspirin EC 81 MG tablet Replaced by:  aspirin 81 MG chewable tablet     TAKE these medications   aspirin 81 MG chewable tablet Chew 1 tablet (81 mg total) by mouth 2 (two) times daily. Replaces:  aspirin EC 81 MG tablet   Biotin 10000 MCG Tabs Take 10,000 mcg by mouth daily.   CENTRUM SILVER 50+WOMEN PO Take 1 tablet by mouth daily.   cholecalciferol 1000 units tablet Commonly known as:  VITAMIN D Take 1,000 Units by mouth daily.   Fish Oil 1000 MG Caps Take 1,000 mg by mouth daily.   HYDROcodone-acetaminophen 5-325 MG tablet Commonly known as:  NORCO/VICODIN Take 1-2 tablets by mouth every 4 (four) hours as needed for moderate pain (pain score 4-6).   hydroxypropyl methylcellulose / hypromellose 2.5 % ophthalmic solution Commonly known as:  ISOPTO TEARS / GONIOVISC Place 1 drop into both eyes 3 (three) times daily as needed for dry eyes.   labetalol 200 MG tablet Commonly known as:  NORMODYNE Take 200 mg by mouth 2 (two) times daily.   losartan-hydrochlorothiazide 100-12.5 MG tablet Commonly known as:  HYZAAR Take 1 tablet by mouth daily.   methocarbamol 500 MG tablet Commonly known as:  ROBAXIN Take 1 tablet (500 mg total) by mouth every 6 (six) hours as needed for muscle spasms.   OVER THE COUNTER MEDICATION Take 1 tablet by mouth 2 (two) times  daily. Viviscal [facility]   pravastatin 40 MG tablet Commonly known as:  PRAVACHOL Take 40 mg by mouth daily.       FOLLOW UP VISIT:   Follow-up Information    [facility] up.   Specialty:  [place] Why:  [facility] will follow up with physical therapy Contact information: [place] [address] [phone]           DISPOSITION:   Home  CONDITION:  Stable   Mike Craze. [place], [place] [phone]  10/17/2017 12:17 PM

## 2017-10-19 ENCOUNTER — Ambulatory Visit (INDEPENDENT_AMBULATORY_CARE_PROVIDER_SITE_OTHER): Payer: Medicare Other | Admitting: Orthopaedic Surgery

## 2017-10-19 ENCOUNTER — Encounter (INDEPENDENT_AMBULATORY_CARE_PROVIDER_SITE_OTHER): Payer: Self-pay | Admitting: Orthopaedic Surgery

## 2017-10-19 DIAGNOSIS — Z96641 Presence of right artificial hip joint: Secondary | ICD-10-CM

## 2017-10-19 MED ORDER — HYDROCODONE-ACETAMINOPHEN 5-325 MG PO TABS: 1.0000 | ORAL_TABLET | ORAL | 0 refills | Status: DC | PRN

## 2017-10-19 NOTE — Progress Notes (Signed)
Office Visit Note   Patient: Kristi Wiley           Date of Birth: 1940/05/26           MRN: [id number] Visit Date: 10/19/2017              Requested by: Reita Cliche, MD No address on file PCP: Reita Cliche, MD   Assessment & Plan: Visit Diagnoses:  1. Status post total replacement of right hip     Plan: She will continue to increase her activities as she tolerates.  I will send in some more hydrocodone for her.  We will Shimon her back in 4 weeks to [place] how she is doing but no x-rays are needed.  Follow-Up Instructions: Return in about 1 month (around 11/16/2017).   Orders:  No orders of the defined types were placed in this encounter.  No orders of the defined types were placed in this encounter.     Procedures: No procedures performed   Clinical Data: No additional findings.   Subjective: Chief Complaint  Patient presents with  . Right Hip - Routine Post Op  The patient is 2 weeks tomorrow status post a right total hip arthroplasty direct anterior approach.  She is doing well overall.  She is ambling with a cane.  She has 1 more home health visit with her tomorrow.  HPI  Review of Systems She denies any fever, chills, nausea, vomiting.  She is alert and oriented x3 and in no acute distress.  She is ambulaing   Objective: Vital Signs: There were no vitals taken for this visit.  Physical Exam She is alert and oriented x3 and in no acute distress Ortho Exam Examination of her right hip shows her incision looks good.  I remove the old Steri-Strips in place new Steri-Strips.  There is no significant seroma.  Her leg lengths are equal.  She tolerates room moving her hip around. Specialty Comments:  No specialty comments available.  Imaging: No results found.   PMFS History: Patient Active Problem List   Diagnosis Date Noted  . 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 Pain of right hip joint 06/19/2017  . Unilateral primary  osteoarthritis, 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  . Unilateral primary osteoarthritis, 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  . 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   Past Medical History:  Diagnosis Date  . Arthritis    oa of right hip , left hip , left thumb   . Breast cancer ([place])   . Elevated cholesterol   . Hypertension   . Mitral valve disorder    sicne birth   . Tinnitus     History reviewed. No pertinent family history.  Past Surgical History:  Procedure Laterality Date  . ABDOMINAL HYSTERECTOMY  1986  . BREAST SURGERY Right 2000   reconstructive    . CATARACT EXTRACTION, BILATERAL  2015  . eye cosmetic  Bilateral    [place]   . 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 TONSILLECTOMY    . TOTAL HIP ARTHROPLASTY Right 10/06/2017   Procedure: RIGHT TOTAL HIP ARTHROPLASTY ANTERIOR APPROACH;  Surgeon: Mcarthur Rossetti, MD;  Location: [facility];  Service: Orthopedics;  Laterality: Right;  . TOTAL MASTECTOMY Right 01/24/1996   Social History   Occupational History  . Not on file  Tobacco Use  . Smoking status: Never Smoker  . Smokeless tobacco: Never Used  Substance and Sexual Activity  .  Alcohol use: Yes    Comment: rare , wine    . Drug use: Never  . Sexual activity: Not on file

## 2017-11-16 ENCOUNTER — Encounter (INDEPENDENT_AMBULATORY_CARE_PROVIDER_SITE_OTHER): Payer: Self-pay | Admitting: Orthopaedic Surgery

## 2017-11-16 ENCOUNTER — Ambulatory Visit (INDEPENDENT_AMBULATORY_CARE_PROVIDER_SITE_OTHER): Payer: Medicare Other | Admitting: Orthopaedic Surgery

## 2017-11-16 DIAGNOSIS — Z96641 Presence of right artificial hip joint: Secondary | ICD-10-CM | POA: Diagnosis not present

## 2017-11-16 NOTE — Progress Notes (Signed)
The patient is 41 days status post a right total hip arthroplasty.  She has a little bit of start up pain and definitely numbness around her right thigh incision but overall she feels like she is making good progress.  Sheri stopped using a cane about 2 weeks ago.  She does not really take any pain medication either.  On exam I can easily put her right hip through internal and external rotation without difficulty at all.  She does have subjective decreased sensation around the lateral femoral cutaneous nerve distribution.  Over this will continue to improve with time.  At this point she will continue increase her activities as comfort allows with no restrictions.  We will [place] her back in 6 months with a low AP pelvis and lateral of her right operative hip.

## 2018-05-17 ENCOUNTER — Ambulatory Visit (INDEPENDENT_AMBULATORY_CARE_PROVIDER_SITE_OTHER): Payer: Medicare Other

## 2018-05-17 ENCOUNTER — Ambulatory Visit (INDEPENDENT_AMBULATORY_CARE_PROVIDER_SITE_OTHER): Payer: Medicare Other | Admitting: Orthopaedic Surgery

## 2018-05-17 DIAGNOSIS — Z96641 Presence of right artificial hip joint: Secondary | ICD-10-CM

## 2018-05-17 DIAGNOSIS — M1612 Unilateral primary osteoarthritis, left hip: Secondary | ICD-10-CM | POA: Diagnosis not present

## 2018-05-17 HISTORY — DX: Presence of right artificial hip joint: Z96.641

## 2018-05-17 NOTE — Progress Notes (Signed)
The patient is well-known to me.  She is 7 months now status post a right total hip arthroplasty.  She is doing well overall and has no issues with that hip.  She does report some pain and she points to the IT band.  She has known severe end-stage arthritis of her left hip.  She says is not hurting as bad as the other hip did so she is not ready for hip replacement surgery on the left side as of yet.  She is doing well otherwise.  On exam I can easily put her right hip through internal and external rotation with no pain at all.  She does have pain to palpation over the IT band.  Her leg lengths are equal.  Her left hip is stiff with pain in the groin with internal and external rotation of the left hip.  X-rays of her pelvis and right hip shows a well-seated total hip arthroplasty on the right side.  There is end-stage arthritis of her left hip.  At this point she will follow-up as needed.  If she has any problems that are not resolving with her right hip in terms of the IT band she will let us know.  When she gets ready to have a hip replacement on the left side she will also let us know.  All question concerns were answered and addressed.

## 2018-07-23 ENCOUNTER — Encounter: Payer: Self-pay | Admitting: Internal Medicine

## 2019-06-11 NOTE — Progress Notes (Signed)
Referring-Warren Daryel Gerald, MD Reason for referral-mitral valve prolapse  HPI: 79 year old female for evaluation of mitral valve prolapse at request of Reita Cliche, MD. Previously followed by Dr. Donnetta Hutching. Last echocardiogram January 2019 at [facility] showed ejection fraction 65 to [id number], grade 1 diastolic dysfunction, mild left atrial enlargement, mitral valve prolapse with moderate mitral regurgitation.  Has dyspnea with vigorous activities but not with routine activities.  No orthopnea, PND, pedal edema, exertional chest pain, palpitations or syncope.  Cardiology now asked to evaluate.  Current Outpatient Medications  Medication Sig Dispense Refill  . aspirin 81 MG chewable tablet Chew 1 tablet (81 mg total) by mouth 2 (two) times daily. 60 tablet 0  . Biotin 10000 MCG TABS Take 10,000 mcg by mouth daily.    . cholecalciferol (VITAMIN D) 1000 units tablet Take 1,000 Units by mouth daily.    . hydroxypropyl methylcellulose / hypromellose (ISOPTO TEARS / GONIOVISC) 2.5 % ophthalmic solution Place 1 drop into both eyes 3 (three) times daily as needed for dry eyes.    Marland Kitchen labetalol (NORMODYNE) 200 MG tablet Take 200 mg by mouth 2 (two) times daily.    Marland Kitchen losartan-hydrochlorothiazide (HYZAAR) 100-12.5 MG tablet Take 1 tablet by mouth daily.    . Multiple Vitamins-Minerals (CENTRUM SILVER 50+WOMEN PO) Take 1 tablet by mouth daily.    . Omega-3 Fatty Acids (FISH OIL) 1000 MG CAPS Take 1,000 mg by mouth daily.    Marland Kitchen OVER THE COUNTER MEDICATION Take 1 tablet by mouth 2 (two) times daily. Simla    . pravastatin (PRAVACHOL) 40 MG tablet Take 40 mg by mouth daily.     No current facility-administered medications for this visit.    Allergies  Allergen Reactions  . Nitrofurantoin Other ([place] Comments)    turned red in face  . Sulfonamide Derivatives Other ([place] Comments)    Face, ears chest turned real red  . Latex Rash    " swelling"      Past  Medical History:  Diagnosis Date  . Arthritis    oa of right hip , left hip , left thumb   . Breast cancer ([place])   . 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  . Elevated cholesterol   . History of right hip replacement 05/17/2018  . Hypertension   . Mitral valve disorder    sicne birth   . Pain of right hip joint 06/19/2017  . 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 Tinnitus   . Unilateral primary osteoarthritis, 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  . Unilateral primary osteoarthritis, 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    Past Surgical History:  Procedure Laterality Date  . ABDOMINAL HYSTERECTOMY  1986  . BREAST SURGERY Right 2000   reconstructive    . CATARACT EXTRACTION, BILATERAL  2015  . eye cosmetic  Bilateral    [place]   . 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 TONSILLECTOMY    . TOTAL HIP ARTHROPLASTY Right 10/06/2017   Procedure: RIGHT TOTAL HIP ARTHROPLASTY ANTERIOR APPROACH;  Surgeon: Mcarthur Rossetti, MD;  Location: [facility];  Service: Orthopedics;  Laterality: Right;  . TOTAL MASTECTOMY Right 01/24/1996    Social History   Socioeconomic History  . Marital status: Widowed    Spouse name: Not on file  . Number of children: Not on file  . Years of education: Not on file  . Highest education level: Not on file  Occupational  History  . Not on file  Tobacco Use  . Smoking status: Never Smoker  . Smokeless tobacco: Never Used  Substance and Sexual Activity  . Alcohol use: Yes    Comment: rare , wine    . Drug use: Never  . Sexual activity: Not on file  Other Topics Concern  . Not on file  Social History Narrative  . Not on file   Social Determinants of Health   Financial Resource Strain:   . Difficulty of Paying Living Expenses:   Food Insecurity:   . Worried About Charity fundraiser in the Last Year:   . Arboriculturist in the Last Year:   Transportation Needs:   . Film/video editor (Medical):   Marland Kitchen Lack of  Transportation (Non-Medical):   Physical Activity:   . Days of Exercise per Week:   . Minutes of Exercise per Session:   Stress:   . Feeling of Stress :   Social Connections:   . Frequency of Communication with Friends and Family:   . Frequency of Social Gatherings with Friends and Family:   . Attends Religious Services:   . Active Member of Clubs or Organizations:   . Attends Archivist Meetings:   Marland Kitchen Marital Status:   Intimate Partner Violence:   . Fear of Current or Ex-Partner:   . Emotionally Abused:   Marland Kitchen Physically Abused:   . Sexually Abused:     Family History  Problem Relation Age of Onset  . CAD Father     ROS: Left hip arthralgias but no fevers or chills, productive cough, hemoptysis, dysphasia, odynophagia, melena, hematochezia, dysuria, hematuria, rash, seizure activity, orthopnea, PND, pedal edema, claudication. Remaining systems are negative.  Physical Exam:   Blood pressure 126/78, pulse (!) 54, height 5\' 6"  (1.676 m), weight 161 lb 1.9 oz (73.1 kg).  General:  Well developed/well nourished in NAD Skin warm/dry Patient not depressed No peripheral clubbing Back-normal HEENT-normal/normal eyelids Neck supple/normal carotid upstroke bilaterally; no bruits; no JVD; no thyromegaly chest - CTA/ normal expansion CV - RRR/normal S1 and S2; no rubs or gallops;  PMI nondisplaced, 2/6 systolic murmur apex Abdomen -NT/ND, no HSM, no mass, + bowel sounds, no bruit 2+ femoral pulses, no bruits Ext-no edema, chords, 2+ DP Neuro-grossly nonfocal  ECG -sinus bradycardia, first-degree AV block, no ST changes.  Personally reviewed  A/P  1 mitral valve prolapse/moderate MR-patient is asymptomatic.  We will repeat echocardiogram to assess degree of mitral regurgitation, LV function and left ventricular size.  2 hypertension-patient's blood pressure is controlled.  Continue present medical regimen.  3 hyperlipidemia-continue statin.  4 family history of  coronary artery disease-we will arrange a calcium score for risk stratification.  Kirk Ruths, MD

## 2019-06-19 ENCOUNTER — Other Ambulatory Visit: Payer: Self-pay

## 2019-06-19 ENCOUNTER — Ambulatory Visit (INDEPENDENT_AMBULATORY_CARE_PROVIDER_SITE_OTHER): Payer: Medicare Other | Admitting: Cardiology

## 2019-06-19 ENCOUNTER — Encounter: Payer: Self-pay | Admitting: Cardiology

## 2019-06-19 VITALS — BP 126/78 | HR 54 | Ht 66.0 in | Wt 161.1 lb

## 2019-06-19 DIAGNOSIS — Z136 Encounter for screening for cardiovascular disorders: Secondary | ICD-10-CM

## 2019-06-19 DIAGNOSIS — I059 Rheumatic mitral valve disease, unspecified: Secondary | ICD-10-CM | POA: Diagnosis not present

## 2019-06-19 DIAGNOSIS — I10 Essential (primary) hypertension: Secondary | ICD-10-CM

## 2019-06-19 NOTE — Patient Instructions (Signed)
Medication Instructions:  NO CHANGE *If you need a refill on your cardiac medications before your next appointment, please call your pharmacy*   Lab Work: If you have labs (blood work) drawn today and your tests are completely normal, you will receive your results only by: Marland Kitchen MyChart Message (if you have MyChart) OR . A paper copy in the mail If you have any lab test that is abnormal or we need to change your treatment, we will call you to review the results.   Testing/Procedures: Your physician has requested that you have an echocardiogram. Echocardiography is a painless test that uses sound waves to create images of your heart. It provides your doctor with information about the size and shape of your heart and how well your heart's chambers and valves are working. This procedure takes approximately one hour. There are no restrictions for this procedure.Pl[facility] CT @ [facility]   Follow-Up: At [facility], you and your health needs are our priority.  As part of our continuing mission to provide you with exceptional heart care, we have created designated Provider Care Teams.  These Care Teams include your primary Cardiologist (physician) and Advanced Practice Providers (APPs -  Physician Assistants and Nurse Practitioners) who all work together to provide you with the care you need, when you need it.  We recommend signing up for the patient portal called "MyChart".  Sign up information is provided on this After Visit Summary.  MyChart is used to connect with patients for Virtual Visits (Telemedicine).  Patients are able to view lab/test results, encounter notes, upcoming appointments, etc.  Non-urgent messages can be sent to your provider as well.   To learn more about what you can do with MyChart, go to [email or web address].    Your next appointment:   12 month(s)  The format for your next appointment:   Either In Person or  Virtual  Provider:   Kirk Ruths, MD

## 2019-07-04 ENCOUNTER — Ambulatory Visit (INDEPENDENT_AMBULATORY_CARE_PROVIDER_SITE_OTHER)
Admission: RE | Admit: 2019-07-04 | Discharge: 2019-07-04 | Disposition: A | Discharge status: Home / Self Care | Payer: Self-pay | Source: Ambulatory Visit | Attending: Cardiology | Admitting: Cardiology

## 2019-07-04 ENCOUNTER — Other Ambulatory Visit: Payer: Self-pay

## 2019-07-04 ENCOUNTER — Ambulatory Visit (HOSPITAL_COMMUNITY): Payer: Medicare Other | Attending: Cardiology

## 2019-07-04 DIAGNOSIS — Z136 Encounter for screening for cardiovascular disorders: Secondary | ICD-10-CM

## 2019-07-04 DIAGNOSIS — I059 Rheumatic mitral valve disease, unspecified: Secondary | ICD-10-CM | POA: Diagnosis not present

## 2019-07-05 ENCOUNTER — Telehealth: Payer: Self-pay | Admitting: Cardiology

## 2019-07-05 NOTE — Telephone Encounter (Signed)
Reviewed Calcium CT and ECHO results with pt. Verbalized understanding with no other questions at this time.

## 2019-07-05 NOTE — Telephone Encounter (Signed)
   Pt is returning call from [place], she said it's regarding her CT results  Please call

## 2019-07-10 ENCOUNTER — Ambulatory Visit (INDEPENDENT_AMBULATORY_CARE_PROVIDER_SITE_OTHER): Payer: Medicare Other | Admitting: Orthopaedic Surgery

## 2019-07-10 ENCOUNTER — Other Ambulatory Visit: Payer: Self-pay

## 2019-07-10 ENCOUNTER — Ambulatory Visit (INDEPENDENT_AMBULATORY_CARE_PROVIDER_SITE_OTHER): Payer: Medicare Other

## 2019-07-10 VITALS — Ht 66.0 in | Wt 160.0 lb

## 2019-07-10 DIAGNOSIS — M25552 Pain in left hip: Secondary | ICD-10-CM

## 2019-07-10 DIAGNOSIS — M1612 Unilateral primary osteoarthritis, left hip: Secondary | ICD-10-CM | POA: Diagnosis not present

## 2019-07-10 DIAGNOSIS — Z96641 Presence of right artificial hip joint: Secondary | ICD-10-CM | POA: Diagnosis not present

## 2019-07-10 NOTE — Progress Notes (Signed)
Office Visit Note   Patient: Kristi Wiley           Date of Birth: Aug 21, 1940           MRN: [id number] Visit Date: 07/10/2019              Requested by: Reita Cliche, MD No address on file PCP: Reita Cliche, MD   Assessment & Plan: Visit Diagnoses:  1. Pain in left hip   2. Unilateral primary osteoarthritis, left hip   3. Status post total replacement of right hip     Plan: At this point I do feel that proceeding with a left total hip arthroplasty is appropriate given the severity of arthritis on plain films and clinical exam and given the very conservative treatment.  She understands fully the risk and benefits of the surgery having had it done before.  We had a long and thorough discussion again about what it involves.  All question concerns were answered addressed.  We will work on getting this scheduled in June for when she would like to have this done.  Follow-Up Instructions: Return for 2 weeks post-op.   Orders:  Orders Placed This Encounter  Procedures  . XR HIP UNILAT W OR W/O PELVIS 1V LEFT   No orders of the defined types were placed in this encounter.     Procedures: No procedures performed   Clinical Data: No additional findings.   Subjective: Chief Complaint  Patient presents with  . Left Hip - Pain  The patient comes in today for evaluation treatment of severe end-stage arthritis of her left hip.  This is been well-documented and we saw her last last year but delayed anything due to the coronavirus pandemic.  She has a history of a right total hip arthroplasty that we replaced in July 2019.  Left hip pain has become quite severe.  At this point it is detrimentally affecting her mobility, her quality of life, and her actives of daily living.  This is been going on for at least 2 years now and getting worse.  She has done well with her right total hip arthroplasty.  She has had no other acute changes in her medical status.  She is followed by  cardiology for mitral valve prolapse.  HPI  Review of Systems She currently denies any headache, chest pain, shortness of breath, fever, chills, nausea, vomiting  Objective: Vital Signs: Ht 5\' 6"  (1.676 m)   Wt 160 lb (72.6 kg)   BMI 25.82 kg/m   Physical Exam She is alert and orient x3 and in no acute distress Ortho Exam Examination of her right hip shows that it moves smoothly and fluidly.  Examination of her left hip shows almost essentially no internal and external rotation due to severe pain and stiffness. Specialty Comments:  No specialty comments available.  Imaging: XR HIP UNILAT W OR W/O PELVIS 1V LEFT  Result Date: 07/10/2019 A low AP pelvis and lateral of the left hip shows severe end-stage arthritis of the left hip.  This is worsened when compared to films from 2019.  There is flattening of the femoral head.  There is complete loss of superior lateral joint space.  There is cystic changes in the femoral head and acetabulum as well as periarticular osteophytes.    PMFS History: Patient Active Problem List   Diagnosis Date Noted  . History of right hip replacement 05/17/2018  . 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  Pain of right hip joint 06/19/2017  . Unilateral primary osteoarthritis, 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  . Unilateral primary osteoarthritis, 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  . 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   Past Medical History:  Diagnosis Date  . Arthritis    oa of right hip , left hip , left thumb   . Breast cancer ([place])   . 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  . Elevated cholesterol   . History of right hip replacement 05/17/2018  . Hypertension   . Mitral valve disorder    sicne birth   . Pain of right hip joint 06/19/2017  . 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 Tinnitus   . Unilateral primary osteoarthritis, 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  . Unilateral primary  osteoarthritis, 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    Family History  Problem Relation Age of Onset  . CAD Father     Past Surgical History:  Procedure Laterality Date  . ABDOMINAL HYSTERECTOMY  1986  . BREAST SURGERY Right 2000   reconstructive    . CATARACT EXTRACTION, BILATERAL  2015  . eye cosmetic  Bilateral    [place]   . 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 TONSILLECTOMY    . TOTAL HIP ARTHROPLASTY Right 10/06/2017   Procedure: RIGHT TOTAL HIP ARTHROPLASTY ANTERIOR APPROACH;  Surgeon: Mcarthur Rossetti, MD;  Location: [facility];  Service: Orthopedics;  Laterality: Right;  . TOTAL MASTECTOMY Right 01/24/1996   Social History   Occupational History  . Not on file  Tobacco Use  . Smoking status: Never Smoker  . Smokeless tobacco: Never Used  Substance and Sexual Activity  . Alcohol use: Yes    Comment: rare , wine    . Drug use: Never  . Sexual activity: Not on file

## 2019-07-29 ENCOUNTER — Other Ambulatory Visit: Payer: Self-pay

## 2019-08-21 ENCOUNTER — Other Ambulatory Visit: Payer: Self-pay | Admitting: Physician Assistant

## 2019-08-23 NOTE — Patient Instructions (Addendum)
DUE TO COVID-19 ONLY ONE VISITOR IS ALLOWED TO COME WITH YOU AND STAY IN THE WAITING ROOM ONLY DURING PRE OP AND PROCEDURE DAY OF SURGERY. THE 2 VISITORS  MAY VISIT WITH YOU AFTER SURGERY IN YOUR PRIVATE ROOM DURING VISITING HOURS ONLY!  YOU NEED TO HAVE A COVID 19 TEST ON_6/8______ @__12 :05 pm_____, THIS TEST MUST BE DONE BEFORE SURGERY, COME  [place] , [id number].  ([place]) ONCE YOUR COVID TEST IS COMPLETED, PLEASE BEGIN THE QUARANTINE INSTRUCTIONS AS OUTLINED IN YOUR HANDOUT.                Kristi Wiley    Your procedure is scheduled on: 09/06/19   Report to [facility] Main  Entrance   Report to Short Stay at 5:30 AM     Call this number if you have problems the morning of surgery [phone]    . BRUSH YOUR TEETH MORNING OF SURGERY AND RINSE YOUR MOUTH OUT, NO CHEWING GUM CANDY OR MINTS.   Do not eat food After Midnight.   YOU MAY HAVE CLEAR LIQUIDS FROM MIDNIGHT UNTIL 4:30AM  . At 4:30AM Please finish the prescribed Pre-Surgery  drink.   Nothing by mouth after you finish the  drink !   Take these medicines the morning of surgery with A SIP OF WATER: Labetalol                                 You may not have any metal on your body including hair pins and              piercings  Do not wear jewelry, make-up, lotions, powders or perfumes, deodorant             Do not wear nail polish on your fingernails.  Do not shave  48 hours prior to surgery.      Do not bring valuables to the hospital. [place].  Contacts, dentures or bridgework may not be worn into surgery.       Name and phone number of your driver:  Special Instructions: N/A              Please read over the following fact sheets you were given: _____________________________________________________________________             [facility] - Preparing for Surgery Before surgery, you can play an important role.    Because skin is not sterile, your skin needs to be as free of germs as possible.   You can reduce the number of germs on your skin by washing with CHG (chlorahexidine gluconate) soap before surgery.   CHG is an antiseptic cleaner which kills germs and bonds with the skin to continue killing germs even after washing. Please DO NOT use if you have an allergy to CHG or antibacterial soaps.   If your skin becomes reddened/irritated stop using the CHG and inform your nurse when you arrive at Short Stay. Do not shave (including legs and underarms) for at least 48 hours prior to the first CHG shower.   Please follow these instructions carefully:  1.  Shower with CHG Soap the night before surgery and the  morning of Surgery.  2.  If you choose to wash your hair, wash your hair first as usual  with your  normal  shampoo.  3.  After you shampoo, rinse your hair and body thoroughly to remove the  shampoo.                                        4.  Use CHG as you would any other liquid soap.  You can apply chg directly  to the skin and wash                       Gently with a scrungie or clean washcloth.  5.  Apply the CHG Soap to your body ONLY FROM THE NECK DOWN.   Do not use on face/ open                           Wound or open sores. Avoid contact with eyes, ears mouth and genitals (private parts).                       Wash face,  Genitals (private parts) with your normal soap.             6.  Wash thoroughly, paying special attention to the area where your surgery  will be performed.  7.  Thoroughly rinse your body with warm water from the neck down.  8.  DO NOT shower/wash with your normal soap after using and rinsing off  the CHG Soap.                9.  Pat yourself dry with a clean towel.            10.  Wear clean pajamas.            11.  Place clean sheets on your bed the night of your first shower and do not  sleep with pets. Day of Surgery : Do not apply any lotions/deodorants the morning  of surgery.  Please wear clean clothes to the hospital/surgery center.  FAILURE TO FOLLOW THESE INSTRUCTIONS MAY RESULT IN THE CANCELLATION OF YOUR SURGERY PATIENT SIGNATURE_________________________________  NURSE SIGNATURE__________________________________  ____  Incentive Spirometer  An incentive spirometer is a tool that can help keep your lungs clear and active. This tool measures how well you are filling your lungs with each breath. Taking long deep breaths may help reverse or decrease the chance of developing breathing (pulmonary) problems (especially infection) following:  A long period of time when you are unable to move or be active. BEFORE THE PROCEDURE   If the spirometer includes an indicator to show your best effort, your nurse or respiratory therapist will set it to a desired goal.  If possible, sit up straight or lean slightly forward. Try not to slouch.  Hold the incentive spirometer in an upright position. INSTRUCTIONS FOR USE  1. Sit on the edge of your bed if possible, or sit up as far as you can in bed or on a chair. 2. Hold the incentive spirometer in an upright position. 3. Breathe out normally. 4. Place the mouthpiece in your mouth and seal your lips tightly around it. 5. Breathe in slowly and as deeply as possible, raising the piston or the ball toward the top of the column. 6. Hold your breath for 3-5 seconds or for as long as possible. Allow the piston or ball to fall  to the bottom of the column. 7. Remove the mouthpiece from your mouth and breathe out normally. 8. Rest for a few seconds and repeat Steps 1 through 7 at least 10 times every 1-2 hours when you are awake. Take your time and take a few normal breaths between deep breaths. 9. The spirometer may include an indicator to show your best effort. Use the indicator as a goal to work toward during each repetition. 10. After each set of 10 deep breaths, practice coughing to be sure your lungs are clear. If  you have an incision (the cut made at the time of surgery), support your incision when coughing by placing a pillow or rolled up towels firmly against it. Once you are able to get out of bed, walk around indoors and cough well. You may stop using the incentive spirometer when instructed by your caregiver.  RISKS AND COMPLICATIONS  Take your time so you do not get dizzy or light-headed.  If you are in pain, you may need to take or ask for pain medication before doing incentive spirometry. It is harder to take a deep breath if you are having pain. AFTER USE  Rest and breathe slowly and easily.  It can be helpful to keep track of a log of your progress. Your caregiver can provide you with a simple table to help with this. If you are using the spirometer at home, follow these instructions: [place] IF:   You are having difficultly using the spirometer.  You have trouble using the spirometer as often as instructed.  Your pain medication is not giving enough relief while using the spirometer.  You develop fever of 100.5 F (38.1 C) or higher. SEEK IMMEDIATE MEDICAL CARE IF:   You cough up bloody sputum that had not been present before.  You develop fever of 102 F (38.9 C) or greater.  You develop worsening pain at or near the incision site. MAKE SURE YOU:   Understand these instructions.  Will watch your condition.  Will get help right away if you are not doing well or get worse. Document Released: 07/25/2006 Document Revised: 06/06/2011 Document Reviewed: 09/25/2006 [facility] Patient Information 2014 [employer], [place].   ________________________________________________________________________ ____________________________________________________________________

## 2019-08-27 ENCOUNTER — Encounter (INDEPENDENT_AMBULATORY_CARE_PROVIDER_SITE_OTHER): Payer: Self-pay

## 2019-08-27 ENCOUNTER — Encounter (HOSPITAL_COMMUNITY)
Admission: RE | Admit: 2019-08-27 | Discharge: 2019-08-27 | Disposition: A | Discharge status: Home / Self Care | Payer: Medicare Other | Source: Ambulatory Visit | Attending: Orthopaedic Surgery | Admitting: Orthopaedic Surgery

## 2019-08-27 ENCOUNTER — Other Ambulatory Visit: Payer: Self-pay

## 2019-08-27 ENCOUNTER — Encounter (HOSPITAL_COMMUNITY): Payer: Self-pay

## 2019-08-27 DIAGNOSIS — Z01812 Encounter for preprocedural laboratory examination: Secondary | ICD-10-CM | POA: Diagnosis not present

## 2019-08-27 LAB — CBC
HCT: 39.5 % (ref 36.0–46.0)
Hemoglobin: 13.4 g/dL (ref 12.0–15.0)
MCH: 31.5 pg (ref 26.0–34.0)
MCHC: 33.9 g/dL (ref 30.0–36.0)
MCV: 92.7 fL (ref 80.0–100.0)
Platelets: 246 10*3/uL (ref 150–400)
RBC: 4.26 MIL/uL (ref 3.87–5.11)
RDW: 13.2 % (ref 11.5–15.5)
WBC: 6.7 10*3/uL (ref 4.0–10.5)
nRBC: 0 % (ref 0.0–0.2)

## 2019-08-27 LAB — BASIC METABOLIC PANEL
Anion gap: 9 (ref 5–15)
BUN: 24 mg/dL — ABNORMAL HIGH (ref 8–23)
CO2: 28 mmol/L (ref 22–32)
Calcium: 9.4 mg/dL (ref 8.9–10.3)
Chloride: 104 mmol/L (ref 98–111)
Creatinine, Ser: 0.87 mg/dL (ref 0.44–1.00)
GFR calc Af Amer: >60 mL/min (ref >60)
GFR calc non Af Amer: >60 mL/min (ref >60)
Glucose, Bld: 92 mg/dL (ref 70–99)
Potassium: 4 mmol/L (ref 3.5–5.1)
Sodium: 141 mmol/L (ref 135–145)

## 2019-08-27 LAB — SURGICAL PCR SCREEN
MRSA, PCR: NEGATIVE
Staphylococcus aureus: NEGATIVE

## 2019-08-27 NOTE — Progress Notes (Signed)
COVID Vaccine Completed:no Date COVID Vaccine completed: COVID vaccine manufacturer: Pfizer    [employer]   PCP - Dr. Dennis Bast Cardiologist - Dr. Thresa Ross  Chest x-ray - no EKG - 07/20/19 Stress Test - no ECHO - 07/14/19 Cardiac Cath - no  Sleep Study - no CPAP -   Fasting Blood Sugar - NA Checks Blood Sugar _____ times a day  Blood Thinner Instructions:ASA  Aspirin Instructions:No instructions received . Pt was told to call Dr. Ninfa Linden Last Dose:  Anesthesia review:   Patient denies shortness of breath, fever, cough and chest pain at PAT appointment yes  Patient verbalized understanding of instructions that were given to them at the PAT appointment. Patient was also instructed that they will need to review over the PAT instructions again at home before surgery. Yes

## 2019-08-28 LAB — ABO/RH: ABO/RH(D): A NEG

## 2019-08-29 ENCOUNTER — Telehealth: Payer: Self-pay | Admitting: Orthopaedic Surgery

## 2019-08-29 NOTE — Telephone Encounter (Signed)
She can take her vitamins

## 2019-08-29 NOTE — Telephone Encounter (Signed)
Vitamins ok to take?

## 2019-08-29 NOTE — Telephone Encounter (Signed)
Pt called wanting to know if there was any medications she needed to stop prior to surgery including anything that she may get over the counter.   [phone]

## 2019-08-30 ENCOUNTER — Encounter (HOSPITAL_COMMUNITY): Payer: Medicare Other

## 2019-09-03 ENCOUNTER — Other Ambulatory Visit (HOSPITAL_COMMUNITY)
Admission: RE | Admit: 2019-09-03 | Discharge: 2019-09-03 | Disposition: A | Discharge status: Home / Self Care | Payer: Medicare Other | Source: Ambulatory Visit | Attending: Orthopaedic Surgery | Admitting: Orthopaedic Surgery

## 2019-09-03 DIAGNOSIS — Z20822 Contact with and (suspected) exposure to covid-19: Secondary | ICD-10-CM | POA: Insufficient documentation

## 2019-09-03 DIAGNOSIS — Z01812 Encounter for preprocedural laboratory examination: Secondary | ICD-10-CM | POA: Insufficient documentation

## 2019-09-03 LAB — SARS CORONAVIRUS 2 (TAT 6-24 HRS): SARS Coronavirus 2: NEGATIVE

## 2019-09-04 NOTE — Progress Notes (Signed)
Anesthesia Chart Review   Case: [id number] Date/Time: 09/06/19 0700   Procedure: LEFT TOTAL HIP ARTHROPLASTY ANTERIOR APPROACH (Left Hip)   Anesthesia type: Spinal   Pre-op diagnosis: left hip osteoarthritis   [facility] 09 / [facility]   Surgeons: Mcarthur Rossetti, MD      DISCUSSION:79 y.o. never smoker with h/o HTN, asymptomatic MVP/MR, breast cancer, left hip OA scheduled for above procedure 09/06/2019 with Dr. Jean Rosenthal.   Pt last seen by cardiologist 06/19/2019.  Per OV note echo to be repeated, CT cardiac scoring ordered for risk stratification.  Pt has dyspnea with vigorous activities, but not with routine activities.    Echo 07/04/2019 with EF 97-67%, grade I diastolic dysfunction, mild mitral valve regurgitation.    Anticipate pt can proceed with planned procedure barring acute status change.   VS: BP (!) 160/66   Pulse (!) 55   Temp 37 C (Oral)   Resp 18   Ht 5\' 6"  (1.676 m)   Wt 72.7 kg   SpO2 98%   BMI 25.85 kg/m   PROVIDERS: Reita Cliche, MD is PCP   Kirk Ruths, MD is Cardiologist  LABS: Labs reviewed: Acceptable for surgery. (all labs ordered are listed, but only abnormal results are displayed)  Labs Reviewed  BASIC METABOLIC PANEL - Abnormal; Notable for the following components:      Result Value   BUN 24 (*)    All other components within normal limits  SURGICAL PCR SCREEN  CBC  TYPE AND SCREEN  ABO/RH     IMAGES: CT Cardiac Scoring 07/04/2019 IMPRESSION: 1. Coronary calcium score of 20. This was 53 percentile for age and sex matched control.  2. Moderate mitral annular calcification. Calcifications in left ventricle suggestive of severe mitral subvalvular apparatus calcification.  3.  Small, primarily posterior pericardial effusion.  EKG: 06/19/2019 Rate 54 bpm Sinus bradycardia with 1st degree AV block Nonspecific ST abnormality Abnormal QRS-T angle, consider primary T wave abnormality   CV: Echo  07/04/2019 IMPRESSIONS    1. Left ventricular ejection fraction, by estimation, is 55 to 60%. The  left ventricle has normal function. The left ventricle has no regional  wall motion abnormalities. Left ventricular diastolic parameters are  consistent with Grade I diastolic  dysfunction (impaired relaxation).  2. Right ventricular systolic function is normal. The right ventricular  size is normal. Tricuspid regurgitation signal is inadequate for assessing  PA pressure.  3. Left atrial size was moderately dilated.  4. Right atrial size was mildly dilated.  5. The mitral valve is normal in structure. Mild mitral valve  regurgitation. No evidence of mitral stenosis.  6. The aortic valve is tricuspid. Aortic valve regurgitation is not  visualized. No aortic stenosis is present.  7. The inferior vena cava is normal in size with greater than 50%  respiratory variability, suggesting right atrial pressure of 3 mmHg.  Past Medical History:  Diagnosis Date  . Arthritis    oa of right hip , left hip , left thumb   . Breast cancer ([place])   . 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  . Elevated cholesterol   . History of right hip replacement 05/17/2018  . Hypertension   . Mitral valve disorder    sicne birth   . Pain of right hip joint 06/19/2017  . 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 Tinnitus   . Unilateral primary osteoarthritis, 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  . Unilateral primary osteoarthritis, right  hip 06/19/2017    Past Surgical History:  Procedure Laterality Date  . ABDOMINAL HYSTERECTOMY  1986  . BREAST SURGERY Right 2000   reconstructive    . CATARACT EXTRACTION, BILATERAL  2015  . eye cosmetic  Bilateral    [place]   . 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 TONSILLECTOMY    . TOTAL HIP ARTHROPLASTY Right 10/06/2017   Procedure: RIGHT TOTAL HIP ARTHROPLASTY ANTERIOR APPROACH;  Surgeon: Mcarthur Rossetti, MD;   Location[facility];  Service: Orthopedics;  Laterality: Right;  . TOTAL MASTECTOMY Right 01/24/1996    MEDICATIONS: . aspirin EC 81 MG tablet  . Biotin 10000 MCG TABS  . cholecalciferol (VITAMIN D) 1000 units tablet  . FIBER PO  . hydroxypropyl methylcellulose / hypromellose (ISOPTO TEARS / GONIOVISC) 2.5 % ophthalmic solution  . labetalol (NORMODYNE) 200 MG tablet  . losartan-hydrochlorothiazide (HYZAAR) 100-12.5 MG tablet  . Multiple Vitamin (MULTIVITAMIN WITH MINERALS) TABS tablet  . Omega-3 Fatty Acids (FISH OIL) 1000 MG CAPS  . OVER THE COUNTER MEDICATION  . pravastatin (PRAVACHOL) 40 MG tablet   No current facility-administered medications for this encounter.     Maia Plan [facility] Pre-Surgical Testing [phone] 09/04/19  10:12 AM

## 2019-09-05 NOTE — Anesthesia Preprocedure Evaluation (Addendum)
Anesthesia Evaluation  Patient identified by MRN, date of birth, ID band Patient awake    Reviewed: Allergy & Precautions, NPO status , Patient's Chart, lab work & pertinent test results, reviewed documented beta blocker date and time   History of Anesthesia Complications Negative for: history of anesthetic complications  Airway Mallampati: II  TM Distance: >3 FB Neck ROM: Full    Dental  (+) Teeth Intact, Dental Advisory Given Upper permanent bridge :   Pulmonary neg pulmonary ROS,    Pulmonary exam normal breath sounds clear to auscultation       Cardiovascular hypertension, Pt. on medications and Pt. on home beta blockers Normal cardiovascular exam+ Valvular Problems/Murmurs MVP and MR  Rhythm:Regular Rate:Normal     Neuro/Psych  Neuromuscular disease negative psych ROS   GI/Hepatic negative GI ROS, Neg liver ROS,   Endo/Other  negative endocrine ROS  Renal/GU negative Renal ROS     Musculoskeletal  (+) Arthritis , Osteoarthritis,    Abdominal   Peds  Hematology negative hematology ROS (+) Plt 246k   Anesthesia Other Findings   Reproductive/Obstetrics                            Anesthesia Physical Anesthesia Plan  ASA: III  Anesthesia Plan: Spinal   Post-op Pain Management:    Induction: Intravenous  PONV Risk Score and Plan: 2 and Propofol infusion and Treatment may vary due to age or medical condition  Airway Management Planned: Natural Airway and Nasal Cannula  Additional Equipment:   Intra-op Plan:   Post-operative Plan:   Informed Consent: I have reviewed the patients History and Physical, chart, labs and discussed the procedure including the risks, benefits and alternatives for the proposed anesthesia with the patient or authorized representative who has indicated his/her understanding and acceptance.     Dental advisory given  Plan Discussed with:  CRNA  Anesthesia Plan Comments:        Anesthesia Quick Evaluation

## 2019-09-05 NOTE — H&P (Signed)
TOTAL HIP ADMISSION H&P  Patient is admitted for left total hip arthroplasty.  Subjective:  Chief Complaint: left hip pain  HPI: Kristi Wiley, 79 y.o. female, has a history of pain and functional disability in the left hip(s) due to arthritis and patient has failed non-surgical conservative treatments for greater than 12 weeks to include NSAID's and/or analgesics, flexibility and strengthening excercises, supervised PT with diminished ADL's post treatment, use of assistive devices and activity modification.  Onset of symptoms was gradual starting 2 years ago with gradually worsening course since that time.The patient noted no past surgery on the left hip(s).  Patient currently rates pain in the left hip at 10 out of 10 with activity. Patient has night pain, worsening of pain with activity and weight bearing, trendelenberg gait, pain that interfers with activities of daily living and pain with passive range of motion. Patient has evidence of subchondral cysts, subchondral sclerosis, periarticular osteophytes and joint space narrowing by imaging studies. This condition presents safety issues increasing the risk of falls.  There is no current active infection.  Patient Active Problem List   Diagnosis Date Noted  . History of right hip replacement 05/17/2018  . 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 Pain of right hip joint 06/19/2017  . Unilateral primary osteoarthritis, 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  . Unilateral primary osteoarthritis, 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  . 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   Past Medical History:  Diagnosis Date  . Arthritis    oa of right hip , left hip , left thumb   . Breast cancer ([place])   . 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  . Elevated cholesterol   . History of right hip replacement 05/17/2018  . Hypertension   . Mitral valve disorder    sicne birth   . Pain of right hip joint 06/19/2017   . 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 Tinnitus   . Unilateral primary osteoarthritis, 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  . Unilateral primary osteoarthritis, 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    Past Surgical History:  Procedure Laterality Date  . ABDOMINAL HYSTERECTOMY  1986  . BREAST SURGERY Right 2000   reconstructive    . CATARACT EXTRACTION, BILATERAL  2015  . eye cosmetic  Bilateral    [place]   . 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 TONSILLECTOMY    . TOTAL HIP ARTHROPLASTY Right 10/06/2017   Procedure: RIGHT TOTAL HIP ARTHROPLASTY ANTERIOR APPROACH;  Surgeon: Mcarthur Rossetti, MD;  Location: [facility];  Service: Orthopedics;  Laterality: Right;  . TOTAL MASTECTOMY Right 01/24/1996    No current facility-administered medications for this encounter.   Current Outpatient Medications  Medication Sig Dispense Refill Last Dose  . aspirin EC 81 MG tablet Take 81 mg by mouth daily after supper.     . Biotin 10000 MCG TABS Take 10,000 mcg by mouth daily after supper.      . cholecalciferol (VITAMIN D) 1000 units tablet Take 1,000 Units by mouth daily.     Marland Kitchen FIBER PO Take 4.2 g by mouth daily. Metamucil     . hydroxypropyl methylcellulose / hypromellose (ISOPTO TEARS / GONIOVISC) 2.5 % ophthalmic solution Place 1 drop into both eyes 3 (three) times daily as needed for dry eyes.     Marland Kitchen labetalol (NORMODYNE) 200 MG tablet Take 200 mg by mouth 2 (two) times daily.     Marland Kitchen losartan-hydrochlorothiazide (HYZAAR)  100-12.5 MG tablet Take 1 tablet by mouth daily at 12 noon. Mid day     . Multiple Vitamin (MULTIVITAMIN WITH MINERALS) TABS tablet Take 1 tablet by mouth daily. Centrum Silver for Women     . Omega-3 Fatty Acids (FISH OIL) 1000 MG CAPS Take 1,000 mg by mouth daily with supper.      Marland Kitchen OVER THE COUNTER MEDICATION Take 1 tablet by mouth 2 (two) times daily. [place]     . pravastatin (PRAVACHOL) 40 MG tablet Take 40 mg by mouth at bedtime.       Allergies   Allergen Reactions  . Nitrofurantoin Other ([place] Comments)    turned red in face  . Sulfonamide Derivatives Other ([place] Comments)    Face, ears chest turned real red  . Latex Rash    " swelling"     Social History   Tobacco Use  . Smoking status: Never Smoker  . Smokeless tobacco: Never Used  Substance Use Topics  . Alcohol use: Yes    Comment: rare , wine      Family History  Problem Relation Age of Onset  . CAD Father      Review of Systems  All other systems reviewed and are negative.   Objective:  Physical Exam  Constitutional: She is oriented to person, place, and time.  HENT:  Head: Normocephalic and atraumatic.  Eyes: Pupils are equal, round, and reactive to light.  Cardiovascular: Normal rate, regular rhythm and normal pulses.  Respiratory: Effort normal.  GI: Soft. Normal appearance and bowel sounds are normal.  Musculoskeletal:     Cervical back: Normal range of motion and neck supple.     Left hip: Tenderness and bony tenderness present. Decreased range of motion. Decreased strength.  Neurological: She is alert and oriented to person, place, and time.    Vital signs in last 24 hours:    Labs:   Estimated body mass index is 25.85 kg/m as calculated from the following:   Height as of 08/27/19: 5\' 6"  (1.676 m).   Weight as of 08/27/19: 72.7 kg.   Imaging Review Plain radiographs demonstrate severe degenerative joint disease of the left hip(s). The bone quality appears to be good for age and reported activity level.      Assessment/Plan:  End stage arthritis, left hip(s)  The patient history, physical examination, clinical judgement of the provider and imaging studies are consistent with end stage degenerative joint disease of the left hip(s) and total hip arthroplasty is deemed medically necessary. The treatment options including medical management, injection therapy, arthroscopy and arthroplasty were discussed at length. The risks and benefits of  total hip arthroplasty were presented and reviewed. The risks due to aseptic loosening, infection, stiffness, dislocation/subluxation,  thromboembolic complications and other imponderables were discussed.  The patient acknowledged the explanation, agreed to proceed with the plan and consent was signed. Patient is being admitted for inpatient treatment for surgery, pain control, PT, OT, prophylactic antibiotics, VTE prophylaxis, progressive ambulation and ADL's and discharge planning.The patient is planning to be discharged home with home health services

## 2019-09-06 ENCOUNTER — Encounter (HOSPITAL_COMMUNITY): Payer: Self-pay | Admitting: Orthopaedic Surgery

## 2019-09-06 ENCOUNTER — Ambulatory Visit (HOSPITAL_COMMUNITY): Payer: Medicare Other | Admitting: Anesthesiology

## 2019-09-06 ENCOUNTER — Ambulatory Visit (HOSPITAL_COMMUNITY): Payer: Medicare Other | Admitting: Physician Assistant

## 2019-09-06 ENCOUNTER — Encounter (HOSPITAL_COMMUNITY)
Admission: RE | Disposition: A | Discharge status: Home / Self Care | Payer: Self-pay | Source: Home / Self Care | Attending: Orthopaedic Surgery

## 2019-09-06 ENCOUNTER — Observation Stay (HOSPITAL_COMMUNITY)
Admission: RE | Admit: 2019-09-06 | Discharge: 2019-09-07 | Disposition: A | Discharge status: Home / Self Care | Payer: Medicare Other | Attending: Orthopaedic Surgery | Admitting: Orthopaedic Surgery

## 2019-09-06 ENCOUNTER — Ambulatory Visit (HOSPITAL_COMMUNITY): Payer: Medicare Other

## 2019-09-06 ENCOUNTER — Other Ambulatory Visit: Payer: Self-pay

## 2019-09-06 ENCOUNTER — Observation Stay (HOSPITAL_COMMUNITY): Payer: Medicare Other

## 2019-09-06 DIAGNOSIS — Z96641 Presence of right artificial hip joint: Secondary | ICD-10-CM | POA: Diagnosis not present

## 2019-09-06 DIAGNOSIS — Z91040 Latex allergy status: Secondary | ICD-10-CM | POA: Diagnosis not present

## 2019-09-06 DIAGNOSIS — E7800 Pure hypercholesterolemia, unspecified: Secondary | ICD-10-CM | POA: Diagnosis not present

## 2019-09-06 DIAGNOSIS — Z882 Allergy status to sulfonamides status: Secondary | ICD-10-CM | POA: Insufficient documentation

## 2019-09-06 DIAGNOSIS — M25751 Osteophyte, right hip: Secondary | ICD-10-CM | POA: Insufficient documentation

## 2019-09-06 DIAGNOSIS — M1612 Unilateral primary osteoarthritis, left hip: Secondary | ICD-10-CM | POA: Diagnosis present

## 2019-09-06 DIAGNOSIS — Z881 Allergy status to other antibiotic agents status: Secondary | ICD-10-CM | POA: Diagnosis not present

## 2019-09-06 DIAGNOSIS — Z79899 Other long term (current) drug therapy: Secondary | ICD-10-CM | POA: Diagnosis not present

## 2019-09-06 DIAGNOSIS — Z853 Personal history of malignant neoplasm of breast: Secondary | ICD-10-CM | POA: Diagnosis not present

## 2019-09-06 DIAGNOSIS — Z9011 Acquired absence of right breast and nipple: Secondary | ICD-10-CM | POA: Diagnosis not present

## 2019-09-06 DIAGNOSIS — I10 Essential (primary) hypertension: Secondary | ICD-10-CM | POA: Diagnosis not present

## 2019-09-06 DIAGNOSIS — Z7982 Long term (current) use of aspirin: Secondary | ICD-10-CM | POA: Diagnosis not present

## 2019-09-06 DIAGNOSIS — Z419 Encounter for procedure for purposes other than remedying health state, unspecified: Secondary | ICD-10-CM

## 2019-09-06 DIAGNOSIS — Z8249 Family history of ischemic heart disease and other diseases of the circulatory system: Secondary | ICD-10-CM | POA: Insufficient documentation

## 2019-09-06 DIAGNOSIS — Z96642 Presence of left artificial hip joint: Secondary | ICD-10-CM

## 2019-09-06 HISTORY — PX: TOTAL HIP ARTHROPLASTY: SHX124

## 2019-09-06 LAB — TYPE AND SCREEN
ABO/RH(D): A NEG
ABO/RH(D): A NEG
Antibody Screen: NEGATIVE
Antibody Screen: NEGATIVE

## 2019-09-06 SURGERY — ARTHROPLASTY, HIP, TOTAL, ANTERIOR APPROACH
Anesthesia: Spinal | Site: Hip | Laterality: Left

## 2019-09-06 MED ORDER — FENTANYL CITRATE (PF) 100 MCG/2ML IJ SOLN: 25.0000 ug | INTRAMUSCULAR | Status: DC | PRN

## 2019-09-06 MED ORDER — ADULT MULTIVITAMIN W/MINERALS CH
1.0000 | ORAL_TABLET | Freq: Every day | ORAL | Status: DC
  Administered 2019-09-06 – 2019-09-07 (×2): 1 via ORAL
  Filled 2019-09-06 (×2): qty 1

## 2019-09-06 MED ORDER — LIDOCAINE HCL (CARDIAC) PF 100 MG/5ML IV SOSY
PREFILLED_SYRINGE | INTRAVENOUS | Status: DC | PRN
  Administered 2019-09-06: 50 mg via INTRAVENOUS

## 2019-09-06 MED ORDER — ASPIRIN 81 MG PO CHEW
81.0000 mg | CHEWABLE_TABLET | Freq: Two times a day (BID) | ORAL | Status: DC
  Administered 2019-09-06 – 2019-09-07 (×2): 81 mg via ORAL
  Filled 2019-09-06 (×2): qty 1

## 2019-09-06 MED ORDER — ALUM & MAG HYDROXIDE-SIMETH 200-200-20 MG/5ML PO SUSP: 30.0000 mL | ORAL | Status: DC | PRN

## 2019-09-06 MED ORDER — ONDANSETRON HCL 4 MG/2ML IJ SOLN: 4.0000 mg | Freq: Four times a day (QID) | INTRAMUSCULAR | Status: DC | PRN

## 2019-09-06 MED ORDER — DIPHENHYDRAMINE HCL 12.5 MG/5ML PO ELIX: 12.5000 mg | ORAL_SOLUTION | ORAL | Status: DC | PRN

## 2019-09-06 MED ORDER — CEFAZOLIN SODIUM-DEXTROSE 2-4 GM/100ML-% IV SOLN
INTRAVENOUS | Status: AC
  Filled 2019-09-06: qty 100

## 2019-09-06 MED ORDER — 0.9 % SODIUM CHLORIDE (POUR BTL) OPTIME
TOPICAL | Status: DC | PRN
  Administered 2019-09-06: 1000 mL

## 2019-09-06 MED ORDER — TRANEXAMIC ACID-NACL 1000-0.7 MG/100ML-% IV SOLN
INTRAVENOUS | Status: AC
  Filled 2019-09-06: qty 100

## 2019-09-06 MED ORDER — PROPOFOL 500 MG/50ML IV EMUL
INTRAVENOUS | Status: AC
  Filled 2019-09-06: qty 50

## 2019-09-06 MED ORDER — POLYVINYL ALCOHOL 1.4 % OP SOLN
1.0000 [drp] | Freq: Three times a day (TID) | OPHTHALMIC | Status: DC | PRN
  Filled 2019-09-06: qty 15

## 2019-09-06 MED ORDER — OXYCODONE HCL 5 MG PO TABS
10.0000 mg | ORAL_TABLET | ORAL | Status: DC | PRN
  Administered 2019-09-07: 10 mg via ORAL

## 2019-09-06 MED ORDER — PRAVASTATIN SODIUM 20 MG PO TABS
40.0000 mg | ORAL_TABLET | Freq: Every day | ORAL | Status: DC
  Administered 2019-09-06: 40 mg via ORAL
  Filled 2019-09-06: qty 2

## 2019-09-06 MED ORDER — ORAL CARE MOUTH RINSE: 15.0000 mL | Freq: Once | OROMUCOSAL | Status: AC

## 2019-09-06 MED ORDER — ACETAMINOPHEN 325 MG PO TABS
325.0000 mg | ORAL_TABLET | Freq: Four times a day (QID) | ORAL | Status: DC | PRN
  Administered 2019-09-07: 650 mg via ORAL
  Filled 2019-09-06: qty 2

## 2019-09-06 MED ORDER — DEXAMETHASONE SODIUM PHOSPHATE 10 MG/ML IJ SOLN
INTRAMUSCULAR | Status: AC
  Filled 2019-09-06: qty 1

## 2019-09-06 MED ORDER — BUPIVACAINE IN DEXTROSE 0.75-8.25 % IT SOLN
INTRATHECAL | Status: DC | PRN
  Administered 2019-09-06: 1.6 mL via INTRATHECAL

## 2019-09-06 MED ORDER — METOCLOPRAMIDE HCL 5 MG PO TABS: 5.0000 mg | ORAL_TABLET | Freq: Three times a day (TID) | ORAL | Status: DC | PRN

## 2019-09-06 MED ORDER — METHOCARBAMOL 500 MG PO TABS
500.0000 mg | ORAL_TABLET | Freq: Four times a day (QID) | ORAL | Status: DC | PRN
  Administered 2019-09-06 – 2019-09-07 (×2): 500 mg via ORAL
  Filled 2019-09-06 (×2): qty 1

## 2019-09-06 MED ORDER — BIOTIN 10000 MCG PO TABS: 10000.0000 ug | ORAL_TABLET | Freq: Every day | ORAL | Status: DC

## 2019-09-06 MED ORDER — LIDOCAINE 2% (20 MG/ML) 5 ML SYRINGE
INTRAMUSCULAR | Status: AC
  Filled 2019-09-06: qty 5

## 2019-09-06 MED ORDER — EPHEDRINE SULFATE 50 MG/ML IJ SOLN
INTRAMUSCULAR | Status: DC | PRN
  Administered 2019-09-06: 10 mg via INTRAVENOUS
  Administered 2019-09-06: 7 mg via INTRAVENOUS

## 2019-09-06 MED ORDER — TRANEXAMIC ACID-NACL 1000-0.7 MG/100ML-% IV SOLN
1000.0000 mg | INTRAVENOUS | Status: AC
  Administered 2019-09-06: 1000 mg via INTRAVENOUS

## 2019-09-06 MED ORDER — ONDANSETRON HCL 4 MG PO TABS: 4.0000 mg | ORAL_TABLET | Freq: Four times a day (QID) | ORAL | Status: DC | PRN

## 2019-09-06 MED ORDER — ACETAMINOPHEN 500 MG PO TABS: 1000.0000 mg | ORAL_TABLET | Freq: Once | ORAL | Status: AC

## 2019-09-06 MED ORDER — HYPROMELLOSE (GONIOSCOPIC) 2.5 % OP SOLN: 1.0000 [drp] | Freq: Three times a day (TID) | OPHTHALMIC | Status: DC | PRN

## 2019-09-06 MED ORDER — STERILE WATER FOR IRRIGATION IR SOLN
Status: DC | PRN
  Administered 2019-09-06: 2000 mL

## 2019-09-06 MED ORDER — LOSARTAN POTASSIUM 50 MG PO TABS
100.0000 mg | ORAL_TABLET | Freq: Every day | ORAL | Status: DC
  Administered 2019-09-07: 100 mg via ORAL
  Filled 2019-09-06: qty 2

## 2019-09-06 MED ORDER — CEFAZOLIN SODIUM-DEXTROSE 1-4 GM/50ML-% IV SOLN
1.0000 g | Freq: Four times a day (QID) | INTRAVENOUS | Status: AC
  Administered 2019-09-06 (×2): 1 g via INTRAVENOUS
  Filled 2019-09-06 (×2): qty 50

## 2019-09-06 MED ORDER — LABETALOL HCL 100 MG PO TABS
200.0000 mg | ORAL_TABLET | Freq: Two times a day (BID) | ORAL | Status: DC
  Administered 2019-09-06 – 2019-09-07 (×2): 200 mg via ORAL
  Filled 2019-09-06 (×2): qty 2

## 2019-09-06 MED ORDER — ACETAMINOPHEN 500 MG PO TABS
ORAL_TABLET | ORAL | Status: AC
  Administered 2019-09-06: 1000 mg via ORAL
  Filled 2019-09-06: qty 2

## 2019-09-06 MED ORDER — HYDROMORPHONE HCL 1 MG/ML IJ SOLN: 0.5000 mg | INTRAMUSCULAR | Status: DC | PRN

## 2019-09-06 MED ORDER — PANTOPRAZOLE SODIUM 40 MG PO TBEC
40.0000 mg | DELAYED_RELEASE_TABLET | Freq: Every day | ORAL | Status: DC
  Filled 2019-09-06 (×2): qty 1

## 2019-09-06 MED ORDER — MIDAZOLAM HCL 2 MG/2ML IJ SOLN
INTRAMUSCULAR | Status: AC
  Filled 2019-09-06: qty 2

## 2019-09-06 MED ORDER — ONDANSETRON HCL 4 MG/2ML IJ SOLN
INTRAMUSCULAR | Status: AC
  Filled 2019-09-06: qty 2

## 2019-09-06 MED ORDER — ONDANSETRON HCL 4 MG/2ML IJ SOLN
INTRAMUSCULAR | Status: DC | PRN
  Administered 2019-09-06: 4 mg via INTRAVENOUS

## 2019-09-06 MED ORDER — OXYCODONE HCL 5 MG PO TABS
5.0000 mg | ORAL_TABLET | ORAL | Status: DC | PRN
  Administered 2019-09-06 (×3): 5 mg via ORAL
  Filled 2019-09-06: qty 1
  Filled 2019-09-06 (×2): qty 2
  Filled 2019-09-06: qty 1

## 2019-09-06 MED ORDER — DOCUSATE SODIUM 100 MG PO CAPS
100.0000 mg | ORAL_CAPSULE | Freq: Two times a day (BID) | ORAL | Status: DC
  Administered 2019-09-06 – 2019-09-07 (×3): 100 mg via ORAL
  Filled 2019-09-06 (×3): qty 1

## 2019-09-06 MED ORDER — CEFAZOLIN SODIUM-DEXTROSE 2-4 GM/100ML-% IV SOLN
2.0000 g | INTRAVENOUS | Status: AC
  Administered 2019-09-06: 2 g via INTRAVENOUS

## 2019-09-06 MED ORDER — SODIUM CHLORIDE 0.9 % IR SOLN
Status: DC | PRN
  Administered 2019-09-06: 1000 mL

## 2019-09-06 MED ORDER — SODIUM CHLORIDE 0.9 % IV SOLN: INTRAVENOUS | Status: DC

## 2019-09-06 MED ORDER — LACTATED RINGERS IV SOLN: INTRAVENOUS | Status: DC

## 2019-09-06 MED ORDER — METOCLOPRAMIDE HCL 5 MG/ML IJ SOLN: 5.0000 mg | Freq: Three times a day (TID) | INTRAMUSCULAR | Status: DC | PRN

## 2019-09-06 MED ORDER — PHENOL 1.4 % MT LIQD: 1.0000 | OROMUCOSAL | Status: DC | PRN

## 2019-09-06 MED ORDER — DEXAMETHASONE SODIUM PHOSPHATE 10 MG/ML IJ SOLN
INTRAMUSCULAR | Status: DC | PRN
Start: 2019-09-06 — End: 2019-09-06
  Administered 2019-09-06: 8 mg via INTRAVENOUS

## 2019-09-06 MED ORDER — METHOCARBAMOL 500 MG IVPB - SIMPLE MED
INTRAVENOUS | Status: AC
  Filled 2019-09-06: qty 50

## 2019-09-06 MED ORDER — VITAMIN D 25 MCG (1000 UNIT) PO TABS
1000.0000 [IU] | ORAL_TABLET | Freq: Every day | ORAL | Status: DC
  Administered 2019-09-06 – 2019-09-07 (×2): 1000 [IU] via ORAL
  Filled 2019-09-06 (×2): qty 1

## 2019-09-06 MED ORDER — LOSARTAN POTASSIUM 50 MG PO TABS
100.0000 mg | ORAL_TABLET | Freq: Once | ORAL | Status: AC
  Administered 2019-09-06: 100 mg via ORAL
  Filled 2019-09-06: qty 2

## 2019-09-06 MED ORDER — METHOCARBAMOL 500 MG IVPB - SIMPLE MED
500.0000 mg | Freq: Four times a day (QID) | INTRAVENOUS | Status: DC | PRN
  Administered 2019-09-06: 500 mg via INTRAVENOUS
  Filled 2019-09-06: qty 50

## 2019-09-06 MED ORDER — HYDROCHLOROTHIAZIDE 12.5 MG PO CAPS
12.5000 mg | ORAL_CAPSULE | Freq: Once | ORAL | Status: AC
  Administered 2019-09-06: 12.5 mg via ORAL
  Filled 2019-09-06: qty 1

## 2019-09-06 MED ORDER — HYDROCHLOROTHIAZIDE 12.5 MG PO CAPS
12.5000 mg | ORAL_CAPSULE | Freq: Every day | ORAL | Status: DC
  Administered 2019-09-07: 12.5 mg via ORAL
  Filled 2019-09-06: qty 1

## 2019-09-06 MED ORDER — FENTANYL CITRATE (PF) 100 MCG/2ML IJ SOLN
INTRAMUSCULAR | Status: DC | PRN
  Administered 2019-09-06: 50 ug via INTRAVENOUS
  Administered 2019-09-06: 25 ug via INTRAVENOUS

## 2019-09-06 MED ORDER — FENTANYL CITRATE (PF) 100 MCG/2ML IJ SOLN
INTRAMUSCULAR | Status: AC
  Filled 2019-09-06: qty 2

## 2019-09-06 MED ORDER — LOSARTAN POTASSIUM-HCTZ 100-12.5 MG PO TABS: 1.0000 | ORAL_TABLET | Freq: Every day | ORAL | Status: DC

## 2019-09-06 MED ORDER — CHLORHEXIDINE GLUCONATE 0.12 % MT SOLN
15.0000 mL | Freq: Once | OROMUCOSAL | Status: AC
  Administered 2019-09-06: 15 mL via OROMUCOSAL

## 2019-09-06 MED ORDER — POVIDONE-IODINE 10 % EX SWAB
2.0000 "application " | Freq: Once | CUTANEOUS | Status: AC
  Administered 2019-09-06: 2 via TOPICAL

## 2019-09-06 MED ORDER — ONDANSETRON HCL 4 MG/2ML IJ SOLN: 4.0000 mg | Freq: Once | INTRAMUSCULAR | Status: DC | PRN

## 2019-09-06 MED ORDER — PROPOFOL 500 MG/50ML IV EMUL
INTRAVENOUS | Status: DC | PRN
  Administered 2019-09-06: 50 ug/kg/min via INTRAVENOUS

## 2019-09-06 MED ORDER — MENTHOL 3 MG MT LOZG: 1.0000 | LOZENGE | OROMUCOSAL | Status: DC | PRN

## 2019-09-06 SURGICAL SUPPLY — 41 items
BAG ZIPLOCK 12X15 (MISCELLANEOUS) IMPLANT
BENZOIN TINCTURE PRP APPL 2/3 (GAUZE/BANDAGES/DRESSINGS) ×3 IMPLANT
BLADE SAW SGTL 18X1.27X75 (BLADE) ×2 IMPLANT
BLADE SAW SGTL 18X1.27X75MM (BLADE) ×1
CLOSURE WOUND 1/2 X4 (GAUZE/BANDAGES/DRESSINGS) ×1
COVER PERINEAL POST (MISCELLANEOUS) ×3 IMPLANT
COVER SURGICAL LIGHT HANDLE (MISCELLANEOUS) ×3 IMPLANT
COVER WAND RF STERILE (DRAPES) ×3 IMPLANT
DRAPE STERI IOBAN 125X83 (DRAPES) ×3 IMPLANT
DRAPE U-SHAPE 47X51 STRL (DRAPES) ×6 IMPLANT
DRSG AQUACEL AG ADV 3.5X10 (GAUZE/BANDAGES/DRESSINGS) ×3 IMPLANT
DURAPREP 26ML APPLICATOR (WOUND CARE) ×3 IMPLANT
ELECT REM PT RETURN 15FT ADLT (MISCELLANEOUS) ×3 IMPLANT
GAUZE XEROFORM 1X8 LF (GAUZE/BANDAGES/DRESSINGS) ×3 IMPLANT
GLOVE BIO SURGEON STRL SZ7.5 (GLOVE) ×3 IMPLANT
GLOVE BIOGEL PI IND STRL 8 (GLOVE) ×2 IMPLANT
GLOVE BIOGEL PI INDICATOR 8 (GLOVE) ×4
GLOVE ECLIPSE 8.0 STRL XLNG CF (GLOVE) ×3 IMPLANT
GOWN STRL REUS W/TWL XL LVL3 (GOWN DISPOSABLE) ×6 IMPLANT
HANDPIECE INTERPULSE COAX TIP (DISPOSABLE) ×3
HEAD M SROM 36MM PLUS 1.5 (Hips) ×1 IMPLANT
HOLDER FOLEY CATH W/STRAP (MISCELLANEOUS) ×3 IMPLANT
KIT TURNOVER KIT A (KITS) IMPLANT
LINER ACETAB NEUTRAL 36ID 520D (Liner) ×3 IMPLANT
PACK ANTERIOR HIP CUSTOM (KITS) ×3 IMPLANT
PENCIL SMOKE EVACUATOR (MISCELLANEOUS) IMPLANT
PIN SECTOR W/GRIP ACE CUP 52MM (Hips) ×3 IMPLANT
SET HNDPC FAN SPRY TIP SCT (DISPOSABLE) ×1 IMPLANT
SROM M HEAD 36MM PLUS 1.5 (Hips) ×3 IMPLANT
STAPLER VISISTAT 35W (STAPLE) IMPLANT
STEM CORAIL KLA12 (Stem) ×3 IMPLANT
STRIP CLOSURE SKIN 1/2X4 (GAUZE/BANDAGES/DRESSINGS) ×2 IMPLANT
SUT ETHIBOND NAB CT1 #1 30IN (SUTURE) ×3 IMPLANT
SUT ETHILON 2 0 PS N (SUTURE) IMPLANT
SUT MNCRL AB 4-0 PS2 18 (SUTURE) IMPLANT
SUT VIC AB 0 CT1 36 (SUTURE) ×3 IMPLANT
SUT VIC AB 1 CT1 36 (SUTURE) ×3 IMPLANT
SUT VIC AB 2-0 CT1 27 (SUTURE) ×6
SUT VIC AB 2-0 CT1 TAPERPNT 27 (SUTURE) ×2 IMPLANT
TRAY FOLEY MTR SLVR 16FR STAT (SET/KITS/TRAYS/PACK) IMPLANT
YANKAUER SUCT BULB TIP 10FT TU (MISCELLANEOUS) ×3 IMPLANT

## 2019-09-06 NOTE — TOC Transition Note (Signed)
Transition of Care [facility]) - CM/SW Discharge Note   Patient Details  Name: Kristi Wiley MRN: [id number] Date of Birth: 18-Jun-1940  [facility] CM/SW Contact:  Lennart Pall, LCSW Phone Number: 09/06/2019, 2:34 PM   Clinical Narrative:  Met with pt and daughter.  Able to confirm pt has all needed DME and [facility] already set up for HHPT.  No TOC needs.     Final next level of care: [place] Barriers to Discharge: Continued Medical Work up   Patient Goals and CMS Choice Patient states their goals for this hospitalization and ongoing recovery are:: go home      Discharge Placement                       Discharge Plan and Services                DME Arranged: N/A DME Agency: NA       HH Arranged: PT (pre-arranged via otho office) Landfall: [facility]now [facility]        Social Determinants of Health (SDOH) Interventions     Readmission Risk Interventions No flowsheet data found.

## 2019-09-06 NOTE — Interval H&P Note (Signed)
History and Physical Interval Note: The patient understands fully that we are proceeding with a left total hip arthroplasty today to treat her left hip osteoarthritis.  There is been no acute change in her medical status.  [place] previous H&P.  The risk and benefits have been discussed in detail and informed consent is obtained.  The left hip has been marked.  09/06/2019 7:01 AM  Kristi Wiley  has presented today for surgery, with the diagnosis of left hip osteoarthritis.  The various methods of treatment have been discussed with the patient and family. After consideration of risks, benefits and other options for treatment, the patient has consented to  Procedure(s): LEFT TOTAL HIP ARTHROPLASTY ANTERIOR APPROACH (Left) as a surgical intervention.  The patient's history has been reviewed, patient examined, no change in status, stable for surgery.  I have reviewed the patient's chart and labs.  Questions were answered to the patient's satisfaction.     Mcarthur Rossetti

## 2019-09-06 NOTE — Op Note (Signed)
NAME: Kristi Wiley, Kristi Wiley MEDICAL RECORD OX:[id number] ACCOUNT [id number] DATE OF BIRTH:1941-03-17 FACILITY: [facility] LOCATION: [facility] PHYSICIAN:Chaden Doom Kerry [place], MD  OPERATIVE REPORT  DATE OF PROCEDURE:  09/06/2019  PREOPERATIVE DIAGNOSIS:  Primary osteoarthritis and degenerative joint disease, left hip.  POSTOPERATIVE DIAGNOSIS:  Primary osteoarthritis and degenerative joint disease, left hip.  PROCEDURE:  Left total hip arthroplasty through direct anterior approach.  IMPLANTS:  DePuy Sector Gription acetabular component size 52, size 36+0 neutral polyethylene liner, size 12 Corail femoral component with varus offset, size 36+1.5 metal hip ball.  SURGEON:  Lind Guest. Ninfa Linden, MD  ASSISTANT:  Erskine Emery, PA-C  ANESTHESIA:  Spinal.  ANTIBIOTICS:  Two grams IV Ancef.  ESTIMATED BLOOD LOSS:  150 mL.  COMPLICATIONS:  None.  INDICATIONS:  The patient is a very active 79 year old female well known to me.  She has debilitating arthritis involving both of her hips and underwent a successful right total hip arthroplasty in 2019.  Her left hip has become more painful.  Her x-rays  show complete loss of joint space.  At this point, it is detrimentally affecting her mobility, her quality of life, and activities of daily living to the point she does wish to proceed with a total hip arthroplasty on the left side.  Having had this  before she is fully aware of the risk of acute blood loss anemia, nerve or vessel injury, fracture, infection, dislocation, DVT and implant failure.  She understands our goals are to decrease pain, improve mobility, and overall improve quality of life.  DESCRIPTION OF PROCEDURE:  After informed consent was obtained and appropriate left hip was marked she was brought to the operating room and sat up on the stretcher where spinal anesthesia was then obtained.  She was laid in supine position on a  stretcher.  Foley catheter was placed.  I was able to assess her  leg lengths and found her just slightly short on her left operative side comparing the left and right side.  Traction boots were placed on both her feet.  Next, she was placed supine on the  Hana fracture table, the perineal post in place and both legs in line skeletal traction device and no traction applied.  Her left operative hip was prepped and draped with DuraPrep and sterile drapes.  A time-out was called.  She was identified as  correct patient, correct left hip.  I then made an incision just inferior and posterior to the anterior superior iliac spine and carried this obliquely down the leg.  We dissected down to the tensor fascia lata muscle.  Tensor fascia was then divided  longitudinally to proceed with direct anterior approach to the hip.  We identified and cauterized circumflex vessels and identified the hip capsule, opened up the hip capsule in an L-type format, finding a moderate joint effusion and significant  periarticular osteophytes around the femoral head and neck.  I placed Cobra retractors around the medial and lateral femoral neck within the joint capsule and made our femoral neck cut with an oscillating saw just proximal to the lesser trochanter.  We  completed this with an osteotome.  I placed a corkscrew guide in the femoral head and removed the femoral head in its entirety and found it to be devoid of cartilage.  I then removed periarticular osteophytes from around the acetabulum and placed a bent  Hohmann over the medial acetabular rim and removed remnants of the acetabular labrum and other debris.  I then began reaming under direct visualization  from a size 44 reamer in stepwise increments going up to a size 51 with all reamers placed under  direct fluoroscopy.  The last reamer placed under direct fluoroscopy and visualization, so we could obtain our depth in reaming, our inclination and anteversion.  I then placed the real DePuy Sector Gription acetabular component size 52 and a  36+0  neutral polyethylene liner.  Attention was then turned to the femur.  With the leg externally rotated to 120 degrees, extended and adducted, we replaced Mueller retractor medially and Hohmann retractor above the greater trochanter, we released lateral  joint capsule and used a box-cutting osteotome to enter the femoral canal and a rongeur to lateralize it.  Then began broaching using the Corail broaching system from a size 8 to a size 12.  Based off her varus offset femoral neck, I did place a varus  offset trial neck and a 36+1.5 trial hip ball.  We brought the leg back over and up and with traction and internal rotation, reducing the pelvis and we were pleased with leg length, offset, range of motion and stability assessed radiographically and  mechanically.  I then dislocated the hip and removed the trial components.  We placed the real Corail femoral component size 12 with varus offset and the real 36+1.5 metal hip ball.  Again, we reduced this in the acetabulum.  We were pleased with  alignment, stability, offset and range of motion.  We then irrigated the soft tissue with normal saline solution using pulsatile lavage.  We closed the joint capsule with interrupted #1 Ethibond suture.  #1 Vicryl was used to close the tensor fascia, 0  Vicryl was used to close deep tissue, 2-0 Vicryl was used to close the subcutaneous tissue and Monocryl suture subcuticular was used to reapproximate the skin.  Steri-Strips were applied as well as an Aquacel dressing.    She was taken off the Hana table and taken to recovery room in stable condition.  All final counts were correct.  There were no complications noted.  Note Benita Stabile, PA-C, assisted during the entire case.  His assistance was crucial for facilitating all  aspects of this case.  CN/NUANCE  D:09/06/2019 T:09/06/2019 JOB:[id number]

## 2019-09-06 NOTE — Plan of Care (Signed)
Plan of care 

## 2019-09-06 NOTE — Transfer of Care (Signed)
Immediate Anesthesia Transfer of Care Note  Patient: Marsi Turvey Doukas  Procedure(s) Performed: LEFT TOTAL HIP ARTHROPLASTY ANTERIOR APPROACH (Left Hip)  Patient Location: PACU  Anesthesia Type:MAC and Spinal  Level of Consciousness: awake, alert , oriented and patient cooperative  Airway & Oxygen Therapy: Patient Spontanous Breathing and Patient connected to face mask oxygen  Post-op Assessment: Report given to RN and Post -op Vital signs reviewed and stable  Post vital signs: Reviewed and stable  Last Vitals:  Vitals Value Taken Time  BP    Temp    Pulse 47 09/06/19 0833  Resp 14 09/06/19 0833  SpO2 100 % 09/06/19 0833  Vitals shown include unvalidated device data.  Last Pain:  Vitals:   09/06/19 0620  TempSrc:   PainSc: 0-No pain         Complications: No complications documented.

## 2019-09-06 NOTE — Brief Op Note (Signed)
09/06/2019  8:13 AM  PATIENT:  Mena Simonis Hulett  79 y.o. female  PRE-OPERATIVE DIAGNOSIS:  left hip osteoarthritis  POST-OPERATIVE DIAGNOSIS:  left hip osteoarthritis  PROCEDURE:  Procedure(s): LEFT TOTAL HIP ARTHROPLASTY ANTERIOR APPROACH (Left)  SURGEON:  Surgeon(s) and Role:    Mcarthur Rossetti, MD - Primary  PHYSICIAN ASSISTANT:  Benita Stabile, PA-C   ANESTHESIA:   spinal  EBL:  150 mL   COUNTS:  YES  DICTATION: .Other Dictation: Dictation Number [id number]  PLAN OF CARE: Admit for overnight observation  PATIENT DISPOSITION:  PACU - hemodynamically stable.   Delay start of Pharmacological VTE agent (>24hrs) due to surgical blood loss or risk of bleeding: no

## 2019-09-06 NOTE — Evaluation (Signed)
Physical Therapy Evaluation Patient Details Name: Kristi Wiley MRN: [id number] DOB: 1940/07/10 Today's Date: 09/06/2019   History of Present Illness  Patient is 79 y.o. female s/p Lt THA anterior approach on 09/06/19 with PMH significant for HTN, OA, breast cancer, Rt THA in 2019.    Clinical Impression  Kristi Wiley is a 79 y.o. female POD 0 s/p Lt THA. Patient reports independence with mobility at baseline. Patient is now limited by functional impairments ([place] PT problem list below) and requires min assist for transfers and gait with RW. Patient was able to ambulate ~90 feet with RW and min assist. Patient instructed in exercise to facilitate ROM and circulation. Patient will benefit from continued skilled PT interventions to address impairments and progress towards PLOF. Acute PT will follow to progress mobility and stair training in preparation for safe discharge home.     Follow Up Recommendations Home health PT;Follow surgeon's recommendation for DC plan and follow-up therapies    Equipment Recommendations  None recommended by PT    Recommendations for Other Services       Precautions / Restrictions Precautions Precautions: Fall Restrictions Weight Bearing Restrictions: No      Mobility  Bed Mobility Overal bed mobility: Needs Assistance Bed Mobility: Supine to Sit     Supine to sit: Min assist;HOB elevated     General bed mobility comments: cues to reach for bed rail and assist for Lt LE mobility to move to EOB.   Transfers Overall transfer level: Needs assistance Equipment used: Rolling walker (2 wheeled) Transfers: Sit to/from Stand Sit to Stand: Min assist         General transfer comment: cues for hand placement/technique with power up, light assist to complete rise.   Ambulation/Gait Ambulation/Gait assistance: Min assist;Min guard Gait Distance (Feet): 90 Feet Assistive device: Rolling walker (2 wheeled) Gait Pattern/deviations: Step-to pattern;Narrow  base of support;Decreased weight shift to left;Decreased stride length Gait velocity: decreased   General Gait Details: cues for safe step pattern and proximity to RW. no overt LOB noted. pt with NBOS and VC's to increase step width.   Stairs            Wheelchair Mobility    Modified Rankin (Stroke Patients Only)       Balance Overall balance assessment: Mild deficits observed, not formally tested            Pertinent Vitals/Pain Pain Assessment: 0-10 Pain Location: Lt hip Pain Descriptors / Indicators: Aching;Discomfort;Burning Pain Intervention(s): Limited activity within patient's tolerance;Monitored during session;Repositioned;Ice applied;Premedicated before session    Home Living Family/patient expects to be discharged to:: Private residence Living Arrangements: Alone Available Help at Discharge: Family Type of Home: House Home Access: Stairs to enter Entrance Stairs-Rails: None Entrance Stairs-Number of Steps: 1+1 Home Layout: One level Home Equipment: Shower seat - built in;Walker - 2 wheels;Bedside commode;Cane - single point Additional Comments: daughter will stay with her for 1 week    Prior Function Level of Independence: Independent         Comments: pt wants to be able to exercise again (pre-covid she exercised 3-4x/week)     Hand Dominance   Dominant Hand: Right    Extremity/Trunk Assessment   Upper Extremity Assessment Upper Extremity Assessment: Overall WFL for tasks assessed    Lower Extremity Assessment Lower Extremity Assessment: Overall WFL for tasks assessed    Cervical / Trunk Assessment Cervical / Trunk Assessment: Normal  Communication   Communication: No difficulties  Cognition Arousal/Alertness: Awake/alert  Behavior During Therapy: WFL for tasks assessed/performed Overall Cognitive Status: Within Functional Limits for tasks assessed         General Comments      Exercises Total Joint Exercises Ankle  Circles/Pumps: AROM;Both;20 reps;Seated Quad Sets: AROM;10 reps;Left;Seated Heel Slides: 5 reps;Left;Seated;AAROM   Assessment/Plan    PT Assessment Patient needs continued PT services  PT Problem List Decreased strength;Decreased range of motion;Decreased activity tolerance;Decreased mobility;Decreased balance;Decreased knowledge of use of DME       PT Treatment Interventions DME instruction;Gait training;Stair training;Functional mobility training;Therapeutic activities;Therapeutic exercise;Balance training;Patient/family education    PT Goals (Current goals can be found in the Care Plan section)  Acute Rehab PT Goals Patient Stated Goal: to get independent and be able to go to exercise class again PT Goal Formulation: With patient Time For Goal Achievement: 09/13/19 Potential to Achieve Goals: Good    Frequency 7X/week    AM-PAC PT "6 Clicks" Mobility  Outcome Measure Help needed turning from your back to your side while in a flat bed without using bedrails?: A Little Help needed moving from lying on your back to sitting on the side of a flat bed without using bedrails?: A Little Help needed moving to and from a bed to a chair (including a wheelchair)?: A Little Help needed standing up from a chair using your arms (e.g., wheelchair or bedside chair)?: A Little Help needed to walk in hospital room?: A Little Help needed climbing 3-5 steps with a railing? : A Little 6 Click Score: 18    End of Session Equipment Utilized During Treatment: Gait belt Activity Tolerance: Patient tolerated treatment well Patient left: in chair;with call bell/phone within reach;with family/visitor present;with chair alarm set Nurse Communication: Mobility status PT Visit Diagnosis: Muscle weakness (generalized) (M62.81);Difficulty in walking, not elsewhere classified (R26.2)    Time: 6195-0932 PT Time Calculation (min) (ACUTE ONLY): 24 min   Charges:   PT Evaluation $PT Eval Low Complexity: 1  Low PT Treatments $Therapeutic Exercise: 8-22 mins        Verner Mould, DPT [place]  Office [phone] Pager [phone]  09/06/2019 3:21 PM

## 2019-09-06 NOTE — Anesthesia Postprocedure Evaluation (Signed)
Anesthesia Post Note  Patient: Kristi Wiley  Procedure(s) Performed: LEFT TOTAL HIP ARTHROPLASTY ANTERIOR APPROACH (Left Hip)     Patient location during evaluation: PACU Anesthesia Type: Spinal Level of consciousness: oriented and awake and alert Pain management: pain level controlled Vital Signs Assessment: post-procedure vital signs reviewed and stable Respiratory status: spontaneous breathing, respiratory function stable and nonlabored ventilation Cardiovascular status: blood pressure returned to baseline and stable Postop Assessment: no headache, no backache, no apparent nausea or vomiting, patient able to bend at knees and spinal receding Anesthetic complications: no   No complications documented.  Last Vitals:  Vitals:   09/06/19 1057 09/06/19 1203  BP: (!) 178/72 (!) 161/67  Pulse: (!) 51 (!) 52  Resp: 15 16  Temp:  36.6 C  SpO2: 99% 100%    Last Pain:  Vitals:   09/06/19 1203  TempSrc: Oral  PainSc:                  Catalina Gravel

## 2019-09-06 NOTE — Progress Notes (Signed)
PHARMACIST - PHYSICIAN ORDER COMMUNICATION  CONCERNING: P&T Medication Policy on Herbal Medications  DESCRIPTION:  This patient's order for:  Biotin  has been noted.  This product(s) is classified as an "herbal" or natural product. Due to a lack of definitive safety studies or FDA approval, nonstandard manufacturing practices, plus the potential risk of unknown drug-drug interactions while on inpatient medications, the Pharmacy and Therapeutics Committee does not permit the use of "herbal" or natural products of this type within [facility].   ACTION TAKEN: The pharmacy department is unable to verify this order at this time and your patient has been informed of this safety policy. Please reevaluate patient's clinical condition at discharge and address if the herbal or natural product(s) should be resumed at that time.  Melora Menon, PharmD 

## 2019-09-06 NOTE — Anesthesia Procedure Notes (Signed)
Spinal  Patient location during procedure: OR Start time: 09/06/2019 7:12 AM End time: 09/06/2019 7:15 AM Staffing Performed: anesthesiologist  Anesthesiologist: Catalina Gravel, MD Preanesthetic Checklist Completed: patient identified, IV checked, risks and benefits discussed, surgical consent, monitors and equipment checked, pre-op evaluation and timeout performed Spinal Block Patient position: sitting Prep: DuraPrep and site prepped and draped Patient monitoring: continuous pulse ox and blood pressure Approach: midline Location: L3-4 Injection technique: single-shot Needle Needle type: Pencan  Needle gauge: 24 G Assessment Sensory level: T8 Additional Notes Functioning IV was confirmed and monitors were applied. Sterile prep and drape, including hand hygiene, mask and sterile gloves were used. The patient was positioned and the spine was prepped. The skin was anesthetized with lidocaine.  Free flow of clear CSF was obtained prior to injecting local anesthetic into the CSF.  The spinal needle aspirated freely following injection.  The needle was carefully withdrawn.  The patient tolerated the procedure well. Consent was obtained prior to procedure with all questions answered and concerns addressed. Risks including but not limited to bleeding, infection, nerve damage, paralysis, failed block, inadequate analgesia, allergic reaction, high spinal, itching and headache were discussed and the patient wished to proceed.   Hoy Morn, MD

## 2019-09-07 DIAGNOSIS — M1612 Unilateral primary osteoarthritis, left hip: Secondary | ICD-10-CM | POA: Diagnosis not present

## 2019-09-07 LAB — CBC
HCT: 30.1 % — ABNORMAL LOW (ref 36.0–46.0)
Hemoglobin: 10.4 g/dL — ABNORMAL LOW (ref 12.0–15.0)
MCH: 31.1 pg (ref 26.0–34.0)
MCHC: 34.6 g/dL (ref 30.0–36.0)
MCV: 90.1 fL (ref 80.0–100.0)
Platelets: 189 10*3/uL (ref 150–400)
RBC: 3.34 MIL/uL — ABNORMAL LOW (ref 3.87–5.11)
RDW: 12.6 % (ref 11.5–15.5)
WBC: 12.6 10*3/uL — ABNORMAL HIGH (ref 4.0–10.5)
nRBC: 0 % (ref 0.0–0.2)

## 2019-09-07 LAB — BASIC METABOLIC PANEL
Anion gap: 6 (ref 5–15)
BUN: 16 mg/dL (ref 8–23)
CO2: 27 mmol/L (ref 22–32)
Calcium: 8.1 mg/dL — ABNORMAL LOW (ref 8.9–10.3)
Chloride: 102 mmol/L (ref 98–111)
Creatinine, Ser: 0.83 mg/dL (ref 0.44–1.00)
GFR calc Af Amer: >60 mL/min (ref >60)
GFR calc non Af Amer: >60 mL/min (ref >60)
Glucose, Bld: 156 mg/dL — ABNORMAL HIGH (ref 70–99)
Potassium: 3.8 mmol/L (ref 3.5–5.1)
Sodium: 135 mmol/L (ref 135–145)

## 2019-09-07 MED ORDER — ASPIRIN 81 MG PO CHEW: 81.0000 mg | CHEWABLE_TABLET | Freq: Two times a day (BID) | ORAL | 0 refills | Status: DC

## 2019-09-07 NOTE — Progress Notes (Signed)
Subjective: 1 Day Post-Op Procedure(s) (LRB): LEFT TOTAL HIP ARTHROPLASTY ANTERIOR APPROACH (Left) Patient reports pain as moderate.    Objective: Vital signs in last 24 hours: Temp:  [97.5 F (36.4 C)-98.3 F (36.8 C)] 98.2 F (36.8 C) (06/12 0529) Pulse Rate:  [45-61] 57 (06/12 0529) Resp:  [11-18] 14 (06/12 0529) BP: (131-178)/(47-72) 137/54 (06/12 0529) SpO2:  [95 %-100 %] 95 % (06/12 0529)  Intake/Output from previous day: 06/11 0701 - 06/12 0700 In: 2680.4 [P.O.:660; I.V.:1120.4; IV Piggyback:900] Out: 2275 [Urine:2125; Blood:150] Intake/Output this shift: No intake/output data recorded.  Recent Labs    09/07/19 0238  HGB 10.4*   Recent Labs    09/07/19 0238  WBC 12.6*  RBC 3.34*  HCT 30.1*  PLT 189   Recent Labs    09/07/19 0238  NA 135  K 3.8  CL 102  CO2 27  BUN 16  CREATININE 0.83  GLUCOSE 156*  CALCIUM 8.1*   No results for input(s): LABPT, INR in the last 72 hours.  Sensation intact distally Intact pulses distally Dorsiflexion/Plantar flexion intact Incision: dressing C/D/I   Assessment/Plan: 1 Day Post-Op Procedure(s) (LRB): LEFT TOTAL HIP ARTHROPLASTY ANTERIOR APPROACH (Left) Up with therapy Discharge home with home health this afternoon.    Patient's anticipated LOS is less than 2 midnights, meeting these requirements: - Younger than 58 - Lives within 1 hour of care - Has a competent adult at home to recover with post-op recover - NO history of  - Chronic pain requiring opiods  - Diabetes  - Coronary Artery Disease  - Heart failure  - Heart attack  - Stroke  - DVT/VTE  - Cardiac arrhythmia  - Respiratory Failure/COPD  - Renal failure  - Anemia  - Advanced Liver disease       Mcarthur Rossetti 09/07/2019, 7:41 AM

## 2019-09-07 NOTE — Plan of Care (Signed)
Pt stable at time of d/c instructions and education. Pt dressing dry and intact. Family at bedside at time of instructions.

## 2019-09-07 NOTE — Discharge Instructions (Signed)
INSTRUCTIONS AFTER JOINT REPLACEMENT   o Remove items at home which could result in a fall. This includes throw rugs or furniture in walking pathways o ICE to the affected joint every three hours while awake for 30 minutes at a time, for at least the first 3-5 days, and then as needed for pain and swelling.  Continue to use ice for pain and swelling. You may notice swelling that will progress down to the foot and ankle.  This is normal after surgery.  Elevate your leg when you are not up walking on it.   o Continue to use the breathing machine you got in the hospital (incentive spirometer) which will help keep your temperature down.  It is common for your temperature to cycle up and down following surgery, especially at night when you are not up moving around and exerting yourself.  The breathing machine keeps your lungs expanded and your temperature down.   DIET:  As you were doing prior to hospitalization, we recommend a well-balanced diet.  DRESSING / WOUND CARE / SHOWERING  Keep the surgical dressing until follow up.  The dressing is water proof, so you can shower without any extra covering.  IF THE DRESSING FALLS OFF or the wound gets wet inside, change the dressing with sterile gauze.  Please use good hand washing techniques before changing the dressing.  Do not use any lotions or creams on the incision until instructed by your surgeon.    ACTIVITY  o Increase activity slowly as tolerated, but follow the weight bearing instructions below.   o No driving for 6 weeks or until further direction given by your physician.  You cannot drive while taking narcotics.  o No lifting or carrying greater than 10 lbs. until further directed by your surgeon. o Avoid periods of inactivity such as sitting longer than an hour when not asleep. This helps prevent blood clots.  o You may return to work once you are authorized by your doctor.     WEIGHT BEARING   Weight bearing as tolerated with assist  device (walker, cane, etc) as directed, use it as long as suggested by your surgeon or therapist, typically at least 4-6 weeks.   EXERCISES  Results after joint replacement surgery are often greatly improved when you follow the exercise, range of motion and muscle strengthening exercises prescribed by your doctor. Safety measures are also important to protect the joint from further injury. Any time any of these exercises cause you to have increased pain or swelling, decrease what you are doing until you are comfortable again and then slowly increase them. If you have problems or questions, call your caregiver or physical therapist for advice.   Rehabilitation is important following a joint replacement. After just a few days of immobilization, the muscles of the leg can become weakened and shrink (atrophy).  These exercises are designed to build up the tone and strength of the thigh and leg muscles and to improve motion. Often times heat used for twenty to thirty minutes before working out will loosen up your tissues and help with improving the range of motion but do not use heat for the first two weeks following surgery (sometimes heat can increase post-operative swelling).   These exercises can be done on a training (exercise) mat, on the floor, on a table or on a bed. Use whatever works the best and is most comfortable for you.    Use music or television while you are exercising so that   the exercises are a pleasant break in your day. This will make your life better with the exercises acting as a break in your routine that you can look forward to.   Perform all exercises about fifteen times, three times per day or as directed.  You should exercise both the operative leg and the other leg as well.  Exercises include:   . Quad Sets - Tighten up the muscle on the front of the thigh (Quad) and hold for 5-10 seconds.   . Straight Leg Raises - With your knee straight (if you were given a brace, keep it on),  lift the leg to 60 degrees, hold for 3 seconds, and slowly lower the leg.  Perform this exercise against resistance later as your leg gets stronger.  . Leg Slides: Lying on your back, slowly slide your foot toward your buttocks, bending your knee up off the floor (only go as far as is comfortable). Then slowly slide your foot back down until your leg is flat on the floor again.  . Angel Wings: Lying on your back spread your legs to the side as far apart as you can without causing discomfort.  . Hamstring Strength:  Lying on your back, push your heel against the floor with your leg straight by tightening up the muscles of your buttocks.  Repeat, but this time bend your knee to a comfortable angle, and push your heel against the floor.  You may put a pillow under the heel to make it more comfortable if necessary.   A rehabilitation program following joint replacement surgery can speed recovery and prevent re-injury in the future due to weakened muscles. Contact your doctor or a physical therapist for more information on knee rehabilitation.    CONSTIPATION  Constipation is defined medically as fewer than three stools per week and severe constipation as less than one stool per week.  Even if you have a regular bowel pattern at home, your normal regimen is likely to be disrupted due to multiple reasons following surgery.  Combination of anesthesia, postoperative narcotics, change in appetite and fluid intake all can affect your bowels.   YOU MUST use at least one of the following options; they are listed in order of increasing strength to get the job done.  They are all available over the counter, and you may need to use some, POSSIBLY even all of these options:    Drink plenty of fluids (prune juice may be helpful) and high fiber foods Colace 100 mg by mouth twice a day  Senokot for constipation as directed and as needed Dulcolax (bisacodyl), take with full glass of water  Miralax (polyethylene glycol)  once or twice a day as needed.  If you have tried all these things and are unable to have a bowel movement in the first 3-4 days after surgery call either your surgeon or your primary doctor.    If you experience loose stools or diarrhea, hold the medications until you stool forms back up.  If your symptoms do not get better within 1 week or if they get worse, check with your doctor.  If you experience "the worst abdominal pain ever" or develop nausea or vomiting, please contact the office immediately for further recommendations for treatment.   ITCHING:  If you experience itching with your medications, try taking only a single pain pill, or even half a pain pill at a time.  You can also use Benadryl over the counter for itching or also to   help with sleep.   TED HOSE STOCKINGS:  Use stockings on both legs until for at least 2 weeks or as directed by physician office. They may be removed at night for sleeping.  MEDICATIONS:  [place] your medication summary on the "After Visit Summary" that nursing will review with you.  You may have some home medications which will be placed on hold until you complete the course of blood thinner medication.  It is important for you to complete the blood thinner medication as prescribed.  PRECAUTIONS:  If you experience chest pain or shortness of breath - call 911 immediately for transfer to the hospital emergency department.   If you develop a fever greater that 101 F, purulent drainage from wound, increased redness or drainage from wound, foul odor from the wound/dressing, or calf pain - CONTACT YOUR SURGEON.                                                   FOLLOW-UP APPOINTMENTS:  If you do not already have a post-op appointment, please call the office for an appointment to be seen by your surgeon.  Guidelines for how soon to be seen are listed in your "After Visit Summary", but are typically between 1-4 weeks after surgery.  OTHER INSTRUCTIONS:   Knee  Replacement:  Do not place pillow under knee, focus on keeping the knee straight while resting. CPM instructions: 0-90 degrees, 2 hours in the morning, 2 hours in the afternoon, and 2 hours in the evening. Place foam block, curve side up under heel at all times except when in CPM or when walking.  DO NOT modify, tear, cut, or change the foam block in any way.   DENTAL ANTIBIOTICS:  In most cases prophylactic antibiotics for Dental procdeures after total joint surgery are not necessary.  Exceptions are as follows:  1. History of prior total joint infection  2. Severely immunocompromised (Organ Transplant, cancer chemotherapy, Rheumatoid biologic meds such as Humera)  3. Poorly controlled diabetes (A1C &gt; 8.0, blood glucose over 200)  If you have one of these conditions, contact your surgeon for an antibiotic prescription, prior to your dental procedure.   MAKE SURE YOU:  . Understand these instructions.  . Get help right away if you are not doing well or get worse.    Thank you for letting us be a part of your medical care team.  It is a privilege we respect greatly.  We hope these instructions will help you stay on track for a fast and full recovery!  Dental Antibiotics:  In most cases prophylactic antibiotics for Dental procdeures after total joint surgery are not necessary.  Exceptions are as follows:  1. History of prior total joint infection  2. Severely immunocompromised (Organ Transplant, cancer chemotherapy, Rheumatoid biologic meds such as Humera)  3. Poorly controlled diabetes (A1C &gt; 8.0, blood glucose over 200)  If you have one of these conditions, contact your surgeon for an antibiotic prescription, prior to your dental procedure. 

## 2019-09-07 NOTE — Progress Notes (Signed)
Physical Therapy Treatment Patient Details Name: Kristi Wiley MRN: [id number] DOB: 1940/06/29 Today's Date: 09/07/2019    History of Present Illness Patient is 79 y.o. female s/p Lt THA anterior approach on 09/06/19 with PMH significant for HTN, OA, breast cancer, Rt THA in 2019.    PT Comments    Pt  Meeting PT goals, making excellent progress, very motivated. Reviewed mobility areas below with family member/caregiver present. Pt is ready to d/t from PT standpoint    Follow Up Recommendations  Home health PT;Follow surgeon's recommendation for DC plan and follow-up therapies     Equipment Recommendations  None recommended by PT    Recommendations for Other Services       Precautions / Restrictions Precautions Precautions: Fall Restrictions Weight Bearing Restrictions: No LLE Weight Bearing: Weight bearing as tolerated Other Position/Activity Restrictions: WBAT    Mobility  Bed Mobility Overal bed mobility: Needs Assistance Bed Mobility: Supine to Sit     Supine to sit: Min guard;HOB elevated     General bed mobility comments: cues to utilize gait belt as leg lifter  Transfers Overall transfer level: Needs assistance Equipment used: Rolling walker (2 wheeled) Transfers: Sit to/from Stand Sit to Stand: Min guard;Supervision         General transfer comment: self cues for hand placement   Ambulation/Gait Ambulation/Gait assistance: Min guard;Supervision Gait Distance (Feet): 160 Feet Assistive device: Rolling walker (2 wheeled) Gait Pattern/deviations: Step-through pattern;Decreased stride length Gait velocity: decreased   General Gait Details: cues for gait progression, Rw position    Stairs Stairs: Yes Stairs assistance: Min guard Stair Management: No rails;Step to pattern;Forwards;With walker Number of Stairs: 1 General stair comments: cues for sequence    Wheelchair Mobility    Modified Rankin (Stroke Patients Only)       Balance                                             Cognition Arousal/Alertness: Awake/alert Behavior During Therapy: WFL for tasks assessed/performed Overall Cognitive Status: Within Functional Limits for tasks assessed                                        Exercises Total Joint Exercises Ankle Circles/Pumps: AROM;Both;20 reps;Seated Quad Sets: AROM;Both;5 reps Heel Slides: AROM;Left;10 reps Hip ABduction/ADduction: AROM;Left;10 reps Long Arc Quad: AROM;Left;10 reps;Seated    General Comments        Pertinent Vitals/Pain Pain Assessment: 0-10 Pain Score: 3  Pain Location: Lt hip Pain Descriptors / Indicators: Aching;Discomfort;Burning Pain Intervention(s): Limited activity within patient's tolerance;Monitored during session;Premedicated before session;Repositioned;Ice applied    Home Living                      Prior Function            PT Goals (current goals can now be found in the care plan section) Acute Rehab PT Goals Patient Stated Goal: to get independent and be able to go to exercise class again PT Goal Formulation: With patient Time For Goal Achievement: 09/13/19 Potential to Achieve Goals: Good Progress towards PT goals: Progressing toward goals    Frequency    7X/week      PT Plan Current plan remains appropriate    Co-evaluation  AM-PAC PT "6 Clicks" Mobility   Outcome Measure  Help needed turning from your back to your side while in a flat bed without using bedrails?: A Little Help needed moving from lying on your back to sitting on the side of a flat bed without using bedrails?: A Little Help needed moving to and from a bed to a chair (including a wheelchair)?: A Little Help needed standing up from a chair using your arms (e.g., wheelchair or bedside chair)?: None Help needed to walk in hospital room?: A Little Help needed climbing 3-5 steps with a railing? : A Little 6 Click Score: 19    End  of Session Equipment Utilized During Treatment: Gait belt Activity Tolerance: Patient tolerated treatment well Patient left: in chair;with call bell/phone within reach;with family/visitor present;with chair alarm set Nurse Communication: Mobility status PT Visit Diagnosis: Muscle weakness (generalized) (M62.81);Difficulty in walking, not elsewhere classified (R26.2)     Time: 1034-1100 PT Time Calculation (min) (ACUTE ONLY): 26 min  Charges:  $Gait Training: 8-22 mins $Therapeutic Exercise: 8-22 mins                     Baxter Flattery, PT  Acute Rehab Dept ([place]) [phone] Pager [phone]  09/07/2019    [facility] 09/07/2019, 11:06 AM

## 2019-09-07 NOTE — Discharge Summary (Signed)
Patient ID: Kristi Wiley MRN: [id number] DOB/AGE: 10/24/1940 79 y.o.  Admit date: 09/06/2019 Discharge date: 09/07/2019  Admission Diagnoses:  Principal Problem:   Unilateral primary osteoarthritis, left hip Active Problems:   Status post total replacement of left hip   Discharge Diagnoses:  Same  Past Medical History:  Diagnosis Date  . Arthritis    oa of right hip , left hip , left thumb   . Breast cancer ([place])   . 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  . Elevated cholesterol   . History of right hip replacement 05/17/2018  . Hypertension   . Mitral valve disorder    sicne birth   . Pain of right hip joint 06/19/2017  . 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 Tinnitus   . Unilateral primary osteoarthritis, 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  . Unilateral primary osteoarthritis, 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    Surgeries: Procedure(s): LEFT TOTAL HIP ARTHROPLASTY ANTERIOR APPROACH on 09/06/2019   Consultants:   Discharged Condition: Improved  Hospital Course: Kristi Wiley is an 79 y.o. female who was admitted 09/06/2019 for operative treatment ofUnilateral primary osteoarthritis, left hip. Patient has severe unremitting pain that affects sleep, daily activities, and work/hobbies. After pre-op clearance the patient was taken to the operating room on 09/06/2019 and underwent  Procedure(s): LEFT TOTAL HIP ARTHROPLASTY ANTERIOR APPROACH.    Patient was given perioperative antibiotics:  Anti-infectives (From admission, onward)   Start     Dose/Rate Route Frequency Ordered Stop   09/06/19 1300  ceFAZolin (ANCEF) IVPB 1 g/50 mL premix        1 g 100 mL/hr over 30 Minutes Intravenous Every 6 hours 09/06/19 1053 09/06/19 2020   09/06/19 0615  ceFAZolin (ANCEF) IVPB 2g/100 mL premix        2 g 200 mL/hr over 30 Minutes Intravenous On call to O.R. 09/06/19 0602 09/06/19 0723   09/06/19 0610  ceFAZolin (ANCEF) 2-4 GM/100ML-% IVPB       Note to Pharmacy:  Kyra Leyland   : cabinet override      09/06/19 0610 09/06/19 0718       Patient was given sequential compression devices, early ambulation, and chemoprophylaxis to prevent DVT.  Patient benefited maximally from hospital stay and there were no complications.    Recent vital signs:  Patient Vitals for the past 24 hrs:  BP Temp Temp src Pulse Resp SpO2  09/07/19 0529 (!) 137/54 98.2 F (36.8 C) Oral (!) 57 14 95 %  09/07/19 0208 (!) 131/51 98.3 F (36.8 C) Oral (!) 58 16 95 %  09/06/19 2245 -- -- -- 61 -- --  09/06/19 2132 (!) 155/72 97.9 F (36.6 C) Oral (!) 54 16 95 %  09/06/19 1738 (!) 156/66 (!) 97.5 F (36.4 C) Oral (!) 50 15 99 %  09/06/19 1438 (!) 133/47 98 F (36.7 C) Oral (!) 54 15 97 %  09/06/19 1307 (!) 161/58 (!) 97.5 F (36.4 C) Oral (!) 56 15 100 %  09/06/19 1203 (!) 161/67 97.8 F (36.6 C) Oral (!) 52 16 100 %  09/06/19 1057 (!) 178/72 -- -- (!) 51 15 99 %  09/06/19 1045 (!) 173/68 -- -- (!) 48 -- 100 %  09/06/19 1030 (!) 173/68 (!) 97.5 F (36.4 C) -- (!) 48 12 100 %  09/06/19 1015 (!) 171/70 -- -- (!) 50 13 100 %  09/06/19 1000 (!) 170/69 -- -- (!) 50 18 100 %  09/06/19 0945 (!)  164/72 -- -- (!) 49 15 100 %  09/06/19 0930 (!) 174/66 97.7 F (36.5 C) -- (!) 45 11 100 %  09/06/19 0915 (!) 161/61 -- -- (!) 47 13 100 %  09/06/19 0900 (!) 144/57 -- -- (!) 51 15 100 %  09/06/19 0845 (!) 141/62 -- -- (!) 48 14 98 %  09/06/19 0834 (!) 143/54 (!) 97.5 F (36.4 C) -- (!) 47 14 100 %     Recent laboratory studies:  Recent Labs    09/07/19 0238  WBC 12.6*  HGB 10.4*  HCT 30.1*  PLT 189  NA 135  K 3.8  CL 102  CO2 27  BUN 16  CREATININE 0.83  GLUCOSE 156*  CALCIUM 8.1*     Discharge Medications:   Allergies as of 09/07/2019      Reactions   Nitrofurantoin Other ([place] Comments)   turned red in face   Sulfonamide Derivatives Other ([place] Comments)   Face, ears chest turned real red   Latex Rash   " swelling"       Medication List    STOP  taking these medications   aspirin EC 81 MG tablet Replaced by: aspirin 81 MG chewable tablet     TAKE these medications   aspirin 81 MG chewable tablet Chew 1 tablet (81 mg total) by mouth 2 (two) times daily. Replaces: aspirin EC 81 MG tablet   Biotin 10000 MCG Tabs Take 10,000 mcg by mouth daily after supper.   cholecalciferol 1000 units tablet Commonly known as: VITAMIN D Take 1,000 Units by mouth daily.   FIBER PO Take 4.2 g by mouth daily. Metamucil   Fish Oil 1000 MG Caps Take 1,000 mg by mouth daily with supper.   hydroxypropyl methylcellulose / hypromellose 2.5 % ophthalmic solution Commonly known as: ISOPTO TEARS / GONIOVISC Place 1 drop into both eyes 3 (three) times daily as needed for dry eyes.   labetalol 200 MG tablet Commonly known as: NORMODYNE Take 200 mg by mouth 2 (two) times daily.   losartan-hydrochlorothiazide 100-12.5 MG tablet Commonly known as: HYZAAR Take 1 tablet by mouth daily at 12 noon. Mid day   multivitamin with minerals Tabs tablet Take 1 tablet by mouth daily. Centrum Silver for Women   OVER THE COUNTER MEDICATION Take 1 tablet by mouth 2 (two) times daily. Viviscal Advanced [facility]   pravastatin 40 MG tablet Commonly known as: PRAVACHOL Take 40 mg by mouth at bedtime.            Durable Medical Equipment  (From admission, onward)         Start     Ordered   09/06/19 1053  DME 3 n 1  Once        09/06/19 1053   09/06/19 1053  DME Walker rolling  Once       Question Answer Comment  Walker: With 5 Inch Wheels   Patient needs a walker to treat with the following condition Status post total replacement of left hip      09/06/19 1053          Diagnostic Studies: DG Pelvis Portable  Result Date: 09/06/2019 CLINICAL DATA:  Status post left hip replacement. EXAM: PORTABLE PELVIS 1-2 VIEWS COMPARISON:  03/16/2017 FINDINGS: Interval left total hip replacement in satisfactory position and alignment. There is also an  interval right total hip replacement in satisfactory position and alignment. No fracture or dislocation seen. IMPRESSION: Satisfactory postoperative appearance of a left total hip  replacement. Electronically Signed   By: Claudie Revering M.D.   On: 09/06/2019 10:53   DG C-Arm 1-60 Min-No Report  Result Date: 09/06/2019 Fluoroscopy was utilized by the requesting physician.  No radiographic interpretation.   DG HIP OPERATIVE UNILAT W OR W/O PELVIS LEFT  Result Date: 09/06/2019 CLINICAL DATA:  Left hip replacement. EXAM: OPERATIVE LEFT HIP (WITH PELVIS IF PERFORMED) 2 VIEWS TECHNIQUE: Fluoroscopic spot image(s) were submitted for interpretation post-operatively. COMPARISON:  07/10/2019 FINDINGS: Interval left total hip replacement in satisfactory position and alignment. No fracture or dislocation seen. IMPRESSION: Satisfactory postoperative appearance of a left total hip replacement. Electronically Signed   By: Claudie Revering M.D.   On: 09/06/2019 10:55    Disposition: Discharge disposition: 01-Home or [place], [facility] Follow up.   Specialty: [place] Why: to provide home health physical therapy Contact information: [address] [place] [address] [phone]        Mcarthur Rossetti, MD Follow up in 2 week(s).   Specialty: Orthopedic Surgery Contact information: [address] [place] [address] [phone]                Signed: Mcarthur Rossetti 09/07/2019, 7:42 AM

## 2019-09-09 ENCOUNTER — Encounter (HOSPITAL_COMMUNITY): Payer: Self-pay | Admitting: Orthopaedic Surgery

## 2019-09-19 ENCOUNTER — Encounter: Payer: Self-pay | Admitting: Orthopaedic Surgery

## 2019-09-19 ENCOUNTER — Ambulatory Visit (INDEPENDENT_AMBULATORY_CARE_PROVIDER_SITE_OTHER): Payer: Medicare Other | Admitting: Orthopaedic Surgery

## 2019-09-19 ENCOUNTER — Other Ambulatory Visit: Payer: Self-pay

## 2019-09-19 DIAGNOSIS — Z96642 Presence of left artificial hip joint: Secondary | ICD-10-CM

## 2019-09-19 NOTE — Progress Notes (Signed)
Today is the first postoperative visit today at almost 2 weeks status post a left total hip arthroplasty.  We have replaced her right hip previously.  She reports no significant issues postoperatively.  She feels like she has made more progress with this hip and her other hip done 2 years ago.  She is ambulating with a cane.  She is a very active 79 years old.  She does not feel like therapy needs to come out of her house for for 4 weeks.  She has been on aspirin twice a day.  She was on it once a day prior to surgery.  She understands she will go back on it once a day.  On exam her left hip incision looks good.  Her leg lengths appear equal.  I removed some of the old Steri-Strips and placed on the Steri-Strips.  There is no significant seroma.  At this point she will increase her activities as comfort allows.  She can drop from my standpoint.  I will Yadao her back in a month to [place] how she is doing overall but no x-rays are needed.  All questions and concerns were answered and addressed.

## 2019-09-25 ENCOUNTER — Telehealth: Payer: Self-pay | Admitting: Orthopaedic Surgery

## 2019-09-25 NOTE — Telephone Encounter (Signed)
Kristi Wiley with [facility] called stating they would be releasing the pt from PT due to her meeting her goals and wanted to make [place] aware.   Kristi Wiley's CB# [phone]

## 2019-09-25 NOTE — Telephone Encounter (Signed)
FYI

## 2019-10-24 ENCOUNTER — Encounter: Payer: Self-pay | Admitting: Orthopaedic Surgery

## 2019-10-24 ENCOUNTER — Ambulatory Visit (INDEPENDENT_AMBULATORY_CARE_PROVIDER_SITE_OTHER): Payer: Medicare Other | Admitting: Orthopaedic Surgery

## 2019-10-24 DIAGNOSIS — Z96642 Presence of left artificial hip joint: Secondary | ICD-10-CM

## 2019-10-24 NOTE — Progress Notes (Signed)
The patient is now 6 weeks status post a left total hip arthroplasty.  We replaced her right hip in 2019.  She is doing well overall but still feels like she has a limp because she had gotten used to walking with a limp before surgery.  Overall though she feels like things are improving for 6-week goes by.  On exam her leg in a supine position.  Her leg lengths are actually equal.  She tolerates me putting both hips through range of motion.  From my standpoint I really do not need to Hao her back for 6 months unless there is any issues.  I would like a standing low AP pelvis at that visit in the lateral both hips.  All questions and concerns were answered and addressed.

## 2019-11-11 ENCOUNTER — Telehealth: Payer: Self-pay | Admitting: Orthopaedic Surgery

## 2019-11-11 NOTE — Telephone Encounter (Signed)
Patient called requesting an earlier appt. She states she is on a walker and needs an earlier appt if we can open a slot. Patient states pain is severe.

## 2019-11-11 NOTE — Telephone Encounter (Signed)
Coming in Wednesday

## 2019-11-13 ENCOUNTER — Ambulatory Visit (INDEPENDENT_AMBULATORY_CARE_PROVIDER_SITE_OTHER): Payer: Medicare Other

## 2019-11-13 ENCOUNTER — Encounter: Payer: Self-pay | Admitting: Orthopaedic Surgery

## 2019-11-13 ENCOUNTER — Ambulatory Visit (INDEPENDENT_AMBULATORY_CARE_PROVIDER_SITE_OTHER): Payer: Medicare Other | Admitting: Orthopaedic Surgery

## 2019-11-13 DIAGNOSIS — M25561 Pain in right knee: Secondary | ICD-10-CM

## 2019-11-13 DIAGNOSIS — Z96641 Presence of right artificial hip joint: Secondary | ICD-10-CM

## 2019-11-13 DIAGNOSIS — Z96642 Presence of left artificial hip joint: Secondary | ICD-10-CM | POA: Diagnosis not present

## 2019-11-13 MED ORDER — LIDOCAINE HCL 1 % IJ SOLN
3.0000 mL | INTRAMUSCULAR | Status: AC | PRN
  Administered 2019-11-13: 3 mL

## 2019-11-13 MED ORDER — METHYLPREDNISOLONE ACETATE 40 MG/ML IJ SUSP
40.0000 mg | INTRAMUSCULAR | Status: AC | PRN
  Administered 2019-11-13: 40 mg via INTRA_ARTICULAR

## 2019-11-13 NOTE — Progress Notes (Signed)
Office Visit Note   Patient: Kristi Wiley           Date of Birth: 1941-02-11           MRN: [id number] Visit Date: 11/13/2019              Requested by: Reita Cliche, MD No address on file PCP: Reita Cliche, MD   Assessment & Plan: Visit Diagnoses:  1. History of right hip replacement   2. Status post total replacement of left hip   3. Acute pain of right knee     Plan: I talked her about this being a knee problem and gave her reassurance that her hips are doing well.  I did recommend a steroid injection in that right knee today and a hinged knee brace and her continue to use her walker until she feels more comfortable and stable on that right knee.  Given normal knee x-rays, my neck step will be to obtain an MRI of that right knee to rule out a ligamentous or tendinous tear or meniscal tear if she does not respond to rest, time, offloading the knee, the steroid injection in the knee brace.  All questions and concerns were answered and addressed.  Follow-Up Instructions: Return in about 2 weeks (around 11/27/2019).   Orders:  Orders Placed This Encounter  Procedures   Large Joint Inj   XR HIPS BILAT W OR W/O PELVIS 3-4 VIEWS   XR Knee 1-2 Views Right   No orders of the defined types were placed in this encounter.     Procedures: Large Joint Inj on 11/13/2019 10:43 AM Indications: diagnostic evaluation and pain Details: 22 G 1.5 in needle, superolateral approach  Arthrogram: No  Medications: 3 mL lidocaine 1 %; 40 mg methylPREDNISolone acetate 40 MG/ML Outcome: tolerated well, no immediate complications Procedure, treatment alternatives, risks and benefits explained, specific risks discussed. Consent was given by the patient. Immediately prior to procedure a time out was called to verify the correct patient, procedure, equipment, support staff and site/side marked as required. Patient was prepped and draped in the usual sterile fashion.       Clinical  Data: No additional findings.   Subjective: Chief Complaint  Patient presents with   Right Hip - Pain  The patient is a 79 year old female that we have replaced both her hips.  The more recent one was her left hip.  However she fell Saturday with her right knee buckling on her and giving way and hyperflexing behind her.  She has had knee pain since then and she is back to using a walker.  She does report some right hip and groin pain.  She was concern is a hip issue but it sounds more like a knee issue.  She is never had knee surgery before.  She does have what she complains of is the beginnings of some neuropathy.  She does not have a primary care physician right now because he retired after her last physical.  She is can start searching for a new primary care physician.  HPI  Review of Systems Today she denies any headache, chest pain, shortness of breath, fever, chills, nausea, vomiting  Objective: Vital Signs: There were no vitals taken for this visit.  Physical Exam She is alert and orient x3 and in no acute distress Ortho Exam Examination of both hips show the move smoothly and fluidly.  Examination of her right knee does show a lot of pain in that knee  past 90 degrees of flexion.  Solid posterior pain but also in the thigh.  Her knee is stable in terms of varus and valgus stressing and there is no significant effusion but she has a positive Murray sign to the medial compartment as well. Specialty Comments:  No specialty comments available.  Imaging: XR HIPS BILAT W OR W/O PELVIS 3-4 VIEWS  Result Date: 11/13/2019 An AP pelvis and lateral of both hips show well-seated total hip arthroplasties with no complicating features.  XR Knee 1-2 Views Right  Result Date: 11/13/2019 2 views of the right knee show no acute findings.    PMFS History: Patient Active Problem List   Diagnosis Date Noted   Status post total replacement of left hip 09/06/2019   History of right hip  replacement 05/17/2018   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   Pain of right hip joint 06/19/2017   Unilateral primary osteoarthritis, 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   Unilateral primary osteoarthritis, 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   Past Medical History:  Diagnosis Date   Arthritis    oa of right hip , left hip , left thumb    Breast cancer ([place])    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   Elevated cholesterol    History of right hip replacement 05/17/2018   Hypertension    Mitral valve disorder    sicne birth    Pain of right hip joint 06/19/2017   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   Tinnitus    Unilateral primary osteoarthritis, 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   Unilateral primary osteoarthritis, 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    Family History  Problem Relation Age of Onset   CAD Father     Past Surgical History:  Procedure Laterality Date   ABDOMINAL HYSTERECTOMY  1986   BREAST SURGERY Right 2000   reconstructive     CATARACT EXTRACTION, BILATERAL  2015   eye cosmetic  Bilateral    [place]    MOHS SURGERY  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    TONSILLECTOMY     TOTAL HIP ARTHROPLASTY Right 10/06/2017   Procedure: RIGHT TOTAL HIP ARTHROPLASTY ANTERIOR APPROACH;  Surgeon: Mcarthur Rossetti, MD;  Location: [facility];  Service: Orthopedics;  Laterality: Right;   TOTAL HIP ARTHROPLASTY Left 09/06/2019   Procedure: LEFT TOTAL HIP ARTHROPLASTY ANTERIOR APPROACH;  Surgeon: Mcarthur Rossetti, MD;  Location: [facility];  Service: Orthopedics;  Laterality: Left;   TOTAL MASTECTOMY Right 01/24/1996   Social History   Occupational History   Not on file  Tobacco Use   Smoking status: Never Smoker   Smokeless tobacco: Never Used  Vaping Use   Vaping Use: Never used  Substance and Sexual Activity   Alcohol use: Yes    Comment: rare , wine     Drug  use: Never   Sexual activity: Not on file

## 2019-11-18 ENCOUNTER — Ambulatory Visit: Payer: Medicare Other | Admitting: Orthopaedic Surgery

## 2019-11-19 ENCOUNTER — Ambulatory Visit: Payer: Medicare Other | Admitting: Orthopaedic Surgery

## 2019-11-27 ENCOUNTER — Other Ambulatory Visit: Payer: Self-pay

## 2019-11-27 ENCOUNTER — Encounter: Payer: Self-pay | Admitting: Orthopaedic Surgery

## 2019-11-27 ENCOUNTER — Ambulatory Visit (INDEPENDENT_AMBULATORY_CARE_PROVIDER_SITE_OTHER): Payer: Medicare Other | Admitting: Orthopaedic Surgery

## 2019-11-27 DIAGNOSIS — Z96641 Presence of right artificial hip joint: Secondary | ICD-10-CM

## 2019-11-27 DIAGNOSIS — Z96642 Presence of left artificial hip joint: Secondary | ICD-10-CM

## 2019-11-27 MED ORDER — METHYLPREDNISOLONE 4 MG PO TABS
ORAL_TABLET | ORAL | 0 refills | Status: DC
Start: 2019-11-27 — End: 2020-06-24

## 2019-11-27 NOTE — Progress Notes (Signed)
The patient comes in today for continued follow-up status post a left total hip arthroplasty to be done in June of this year.  We have also replaced her right hip in 2019.  She is in a knee brace that we put her in a few weeks ago after she had a mechanical fall and felt like her knee gave out on her.  She still having some numbness in that leg.  She feels that she is comfortable driving wants clearance for that.  She does report that she has improved.  On examination of her left hip it moves smoothly and fluidly with no difficulty.  Examination of her right knee shows no effusion.  The knee is ligamentously stable and there are some pain along the anterior shin.  Distally her motor and sensory exam is normal on the right foot.  At this point she will continue to slowly increase her activities as comfort allows.  Since she is having some numbness I would at least like to try a 6-day steroid taper since she is not a diabetic.  She agrees with this plan.  We will [place] her back in January which will be 6 months since her surgery and will have a standing low AP pelvis and lateral of her more recent left hip at that visit.

## 2020-04-27 ENCOUNTER — Encounter: Payer: Self-pay | Admitting: Orthopaedic Surgery

## 2020-04-27 ENCOUNTER — Ambulatory Visit (INDEPENDENT_AMBULATORY_CARE_PROVIDER_SITE_OTHER): Payer: Medicare Other | Admitting: Orthopaedic Surgery

## 2020-04-27 ENCOUNTER — Other Ambulatory Visit: Payer: Self-pay

## 2020-04-27 ENCOUNTER — Ambulatory Visit (INDEPENDENT_AMBULATORY_CARE_PROVIDER_SITE_OTHER): Payer: Medicare Other

## 2020-04-27 DIAGNOSIS — Z96642 Presence of left artificial hip joint: Secondary | ICD-10-CM

## 2020-04-27 DIAGNOSIS — G8929 Other chronic pain: Secondary | ICD-10-CM

## 2020-04-27 NOTE — Progress Notes (Signed)
The patient has a history of a right total hip that we did in 2019 and a left total hip that we did in June 2021.  She is finally doing better overall.  She is having some low back pain.  She is a very active 80 year old female.  I last saw her back in August after mechanical fall in which she hyperflexed her right knee.  That is finally doing a little better for her.  She does get some occasional numbness in the lateral aspect of her right leg.  Both hips are doing well.  I can put both hips through internal and external rotation with no difficulty at all.  Her right knee is also ligamentously stable with no effusion.  There are some subjective numbness at the lateral aspect of her right leg which she says is better.  She does have low back pain the lower aspect of her back more to the left side.  An AP pelvis and lateral of the left hip shows well-seated bilateral total hip arthroplasties with no complicating features.  I would like to send her to outpatient physical therapy for just her low back.  Any modalities per the therapist that can help strengthen her core muscles and help her back where it hurts when she is been standing a lot.  She agrees with this treatment plan.  I will [place] her back in 6 weeks to [place] how she is doing overall.

## 2020-05-12 ENCOUNTER — Other Ambulatory Visit: Payer: Self-pay

## 2020-05-12 ENCOUNTER — Ambulatory Visit: Payer: Medicare Other | Attending: Orthopaedic Surgery

## 2020-05-12 DIAGNOSIS — M25652 Stiffness of left hip, not elsewhere classified: Secondary | ICD-10-CM | POA: Diagnosis present

## 2020-05-12 DIAGNOSIS — M25651 Stiffness of right hip, not elsewhere classified: Secondary | ICD-10-CM | POA: Insufficient documentation

## 2020-05-12 DIAGNOSIS — R209 Unspecified disturbances of skin sensation: Secondary | ICD-10-CM | POA: Diagnosis present

## 2020-05-12 DIAGNOSIS — M25552 Pain in left hip: Secondary | ICD-10-CM | POA: Insufficient documentation

## 2020-05-12 DIAGNOSIS — M25551 Pain in right hip: Secondary | ICD-10-CM | POA: Insufficient documentation

## 2020-05-12 DIAGNOSIS — R293 Abnormal posture: Secondary | ICD-10-CM | POA: Diagnosis present

## 2020-05-12 DIAGNOSIS — M545 Low back pain, unspecified: Secondary | ICD-10-CM | POA: Insufficient documentation

## 2020-05-12 DIAGNOSIS — M6281 Muscle weakness (generalized): Secondary | ICD-10-CM | POA: Diagnosis present

## 2020-05-12 NOTE — Therapy (Signed)
[facility] [address]  [place], [place], [address] Phone: [phone]   Fax:  [phone]  Physical Therapy Evaluation  Patient Details  Name: Kristi Wiley MRN: [id number] Date of Birth: 1941/01/18 Referring Provider (PT): Ninfa Linden   Encounter Date: 05/12/2020   PT End of Session - 05/12/20 1202    Visit Number 1    Number of Visits 17    Date for PT Re-Evaluation 07/07/20    Authorization Type Medicare & BCBS    PT Start Time 9417    PT Stop Time 1100    PT Time Calculation (min) 45 min    Activity Tolerance Patient tolerated treatment well    Behavior During Therapy [facility] for tasks assessed/performed           Past Medical History:  Diagnosis Date  . Arthritis    oa of right hip , left hip , left thumb   . Breast cancer ([place])   . 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  . Elevated cholesterol   . History of right hip replacement 05/17/2018  . Hypertension   . Mitral valve disorder    sicne birth   . Pain of right hip joint 06/19/2017  . 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 Tinnitus   . Unilateral primary osteoarthritis, 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  . Unilateral primary osteoarthritis, 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    Past Surgical History:  Procedure Laterality Date  . ABDOMINAL HYSTERECTOMY  1986  . BREAST SURGERY Right 2000   reconstructive    . CATARACT EXTRACTION, BILATERAL  2015  . eye cosmetic  Bilateral    [place]   . 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 TONSILLECTOMY    . TOTAL HIP ARTHROPLASTY Right 10/06/2017   Procedure: RIGHT TOTAL HIP ARTHROPLASTY ANTERIOR APPROACH;  Surgeon: Mcarthur Rossetti, MD;  Location: [facility];  Service: Orthopedics;  Laterality: Right;  . TOTAL HIP ARTHROPLASTY Left 09/06/2019   Procedure: LEFT TOTAL HIP ARTHROPLASTY ANTERIOR APPROACH;  Surgeon: Mcarthur Rossetti, MD;  Location: [facility];  Service: Orthopedics;   Laterality: Left;  . TOTAL MASTECTOMY Right 01/24/1996    There were no vitals filed for this visit.    Subjective Assessment - 05/12/20 1018    Subjective Had Right THR in 2019 and pt reports after this, leg was numb down to foot but this subsided over time until she got the left hip done in 2021. After havng 2nd hip done, around August 2021 stepped down from a step at home and right leg buckled down, and she Has had some back soreness/weakness after favoring legs since. Has been wearing a waist cincher which has helped with the back pain. She does not  get any numbness into left leg but does continue to get numbness into right foot.    Pertinent History Right THR 2019, left THR July 2021. hysterectomy. Mastectomy 80 yo due to Breast CA    Limitations House hold activities;Walking;Standing    How long can you sit comfortably? unlimited    How long can you walk comfortably? 5-10 minutes    Patient Stated Goals to decrease pain, to get back to walking with decreased pain    Currently in Pain? Yes    Pain Location Back    Pain Orientation Left;Medial    Pain Descriptors / Indicators Other (Comment)   "weakness"   Pain Type Chronic pain  Pain Radiating Towards right knee to foot, numbness    Pain Onset More than a month ago    Pain Frequency Constant    Aggravating Factors  "when i over do things", over 5-10 minutes wlaking    Pain Relieving Factors Sitting    Effect of Pain on Daily Activities Pain and weakness limiting tolerance to household activities and ADLs              [facility] PT Assessment - 05/12/20 1029      Assessment   Medical Diagnosis chronic low back pain with sciatica    Referring Provider (PT) Ninfa Linden    Hand Dominance Right    Next MD Visit March 2021    Prior Therapy after THR x2 but not for LBP      Balance Screen   Has the patient fallen in the past 6 months Yes    How many times? 1 - August 2021 when stepping down with RIGHT, leg felt numb    Has the  patient had a decrease in activity level because of a fear of falling?  Yes    Is the patient reluctant to leave their home because of a fear of falling?  No   just overall not as active since pandemic began     Prior Function   Level of Independence Independent    Vocation Retired    Leisure reading, taking walks, traveling      Cognition   Overall Cognitive Status Within Functional Limits for tasks assessed      Observation/Other Assessments   Focus on Therapeutic Outcomes (FOTO)  to be assessed      Sensation   Additional Comments felt "softer" on the right down by feet for light touch, otherwise WFL B      Posture/Postural Control   Posture/Postural Control Postural limitations    Postural Limitations Rounded Shoulders;Forward head;Increased thoracic kyphosis;Weight shift left      ROM / Strength   AROM / PROM / Strength Strength;AROM      AROM   AROM Assessment Site Lumbar    Lumbar Flexion 75% "stiff"   Lumbar Extension 25%  "stiff"   Lumbar - Right Rotation 25%, "Stiff"    Lumbar - Left Rotation 25%, "Stiff"      Strength   Strength Assessment Site Hip    Right/Left Hip Right;Left    Right Hip Flexion 4-/5    Right Hip External Rotation  3+/5    Right Hip Internal Rotation 4+/5    Right Hip ABduction 4-/5    Right Hip ADduction 4+/5    Left Hip Flexion 4-/5    Left Hip External Rotation 4-/5    Left Hip Internal Rotation 4+/5    Left Hip ABduction 4-/5    Left Hip ADduction 4+/5      Flexibility   Soft Tissue Assessment /Muscle Length yes    Hamstrings mil dtightness B    Quadriceps Mod tightness R>L    ITB Mod tightness B    Piriformis Mod tightness B      Palpation   Palpation comment grossly hypomobile thoracolumbar spine P-A      Transfers   Five time sit to stand comments  13 seconds, poor eccentric control and weight shifting over the left throughout                      Objective measurements completed on examination: [place] above  findings.  PT Education - 05/12/20 1214    Education Details PT POC, Role of PT, Initial HEP. Access Code: [email or web address]   Supine Bridge - 1 x daily - 5 x weekly - 2 sets - 10 reps  Supine Lower Trunk Rotation - 1 x daily - 5 x weekly - 2 sets - 10 reps   Seated March - 1 x daily - 5 x weekly - 2 sets - 10 reps   Sit to Stand with Counter Support - 1 x daily - 5 x weekly - 2 sets - 10 reps   Seated Flexion Stretch with Swiss Ball - 1 x daily - 5 x weekly - 2 sets - 10 reps   Seated Table Piriformis Stretch - 1 x daily - 5 x weekly - 4 sets - 30 seconds hold   Seated Trunk Rotation - 1 x daily - 5 x weekly - 2 sets - 10 reps    Person(s) Educated Patient    Methods Explanation;Handout    Comprehension Verbalized understanding;Returned demonstration;Need further instruction            PT Short Term Goals - 05/12/20 1215      PT SHORT TERM GOAL #1   Title Pt will be independent with initial HEP    Time 2    Period Weeks    Status New    Target Date 05/26/20             PT Long Term Goals - 05/12/20 1216      PT LONG TERM GOAL #1   Title Pt will demo improved BLE strength to >/= 4+/5    Time 8    Status New    Target Date 07/07/20      PT LONG TERM GOAL #2   Title Pt will demo improved control, safety and symmetry with 5 TSTS test    Time 8    Period Weeks    Status New    Target Date 07/07/20      PT LONG TERM GOAL #3   Title Pt will demonstrate improved thoracolumbar rotation ROM with </= 2/10 pain to facilitate safe and comfortable trunk rotation for ADLs and driving    Time 8    Period Weeks    Status New    Target Date 07/07/20      PT LONG TERM GOAL #4   Title Pt will report </= 2/10 low back and leg pain with prolonged walking in community    Time 8    Period Weeks    Status New    Target Date 07/07/20                  Plan - 05/12/20 1203    Clinical Impression Statement Pt is an 80 yo female with complaints of back  weakness and numbness into the right leg. PMH significant for Right THR in 2019 and left THR in 2021. After havng 2nd hip done, around August 2021 stepped down from a step at home and right leg buckled down, and she Has had some back soreness/weakness after favoring legs since. Pt presents with limited trunk and bilateral hip AROM and flexibility, low back weakness with RLE numbness (knee to foot/ankle), muscle weakness, abnormal posture, and abnormal body mechanics favoring her left leg with functional activities. She scored just above the cutoff for the Five time sit to stands test indicating slight falls risk at this time, and with functional sit to stands she was seen to  maintain left weight shift, minimal eccentric control with lowering without UE support, and some dynamic valgus indicating room for improvement for functional strength and balance. Kristi Wiley will benefit from skiled physical therapy to address the aforementioned impairments, decrease pain, and improve safe functional strength and mobility.    Personal Factors and Comorbidities Age;Past/Current Experience;Time since onset of injury/illness/exacerbation;Fitness;Comorbidity 3+    Comorbidities HTN, Arthitis, Hx breast CA    Examination-Activity Limitations Locomotion Level;Stairs;Squat;Stand    Examination-Participation Restrictions Meal Prep;Community Activity;Driving;Interpersonal Relationship    Stability/Clinical Decision Making Evolving/Moderate complexity    Clinical Decision Making Moderate    Rehab Potential Good    PT Frequency 2x / week   2x/wk for first 2 weeks, followed by decrease  to 1x/week   PT Duration 8 weeks (6-8)   PT Treatment/Interventions ADLs/Self Care Home Management;Electrical Stimulation;Iontophoresis 4mg /ml Dexamethasone;Moist Heat;Traction;Cryotherapy;Gait training;Neuromuscular re-education;Balance training;Therapeutic exercise;Therapeutic activities;Functional mobility training;Patient/family education;Manual  techniques;Energy conservation;Taping    PT Next Visit Plan FOTO, Further assess balance next visit. Reassess HEP.  Postural re-ed and strengthening. LE and spinal mobility/flexibility, progressive TE as tolerated. Manual and modalities as tolerated.    PT Home Exercise Plan Initial HEP: [email or web address]    Consulted and Agree with Plan of Care Patient           Patient will benefit from skilled therapeutic intervention in order to improve the following deficits and impairments:  Abnormal gait,Decreased range of motion,Decreased endurance,Pain,Improper body mechanics,Impaired flexibility,Hypomobility,Decreased balance,Decreased mobility,Decreased strength,Impaired sensation,Postural dysfunction  Visit Diagnosis: Muscle weakness (generalized) - Plan: PT plan of care cert/re-cert  Abnormal posture - Plan: PT plan of care cert/re-cert  Low back pain, unspecified back pain laterality, unspecified chronicity, unspecified whether sciatica present - Plan: PT plan of care cert/re-cert  Unspecified disturbances of skin sensation - Plan: PT plan of care cert/re-cert  Pain in right hip - Plan: PT plan of care cert/re-cert  Stiffness of right hip, not elsewhere classified - Plan: PT plan of care cert/re-cert  Stiffness of left hip, not elsewhere classified - Plan: PT plan of care cert/re-cert  Pain in left hip     Problem List Patient Active Problem List   Diagnosis Date Noted  . Status post total replacement of left hip 09/06/2019  . History of right hip replacement 05/17/2018  . 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 Pain of right hip joint 06/19/2017  . Unilateral primary osteoarthritis, 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  . Unilateral primary osteoarthritis, 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  . 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    Hall Busing, PT, DPT 05/12/2020, 12:39 PM  [facility] [address]   [place], [place], [address] Phone: [phone]   Fax:  [phone]  Name: Kristi Wiley MRN: [id number] Date of Birth: 21-Nov-1940

## 2020-05-19 ENCOUNTER — Ambulatory Visit: Payer: Medicare Other | Admitting: Physical Therapy

## 2020-05-19 ENCOUNTER — Encounter: Payer: Self-pay | Admitting: Physical Therapy

## 2020-05-19 ENCOUNTER — Other Ambulatory Visit: Payer: Self-pay

## 2020-05-19 DIAGNOSIS — M545 Low back pain, unspecified: Secondary | ICD-10-CM

## 2020-05-19 DIAGNOSIS — M6281 Muscle weakness (generalized): Secondary | ICD-10-CM | POA: Diagnosis not present

## 2020-05-19 DIAGNOSIS — M25551 Pain in right hip: Secondary | ICD-10-CM

## 2020-05-19 DIAGNOSIS — R209 Unspecified disturbances of skin sensation: Secondary | ICD-10-CM

## 2020-05-19 DIAGNOSIS — R293 Abnormal posture: Secondary | ICD-10-CM

## 2020-05-19 DIAGNOSIS — M25552 Pain in left hip: Secondary | ICD-10-CM

## 2020-05-19 DIAGNOSIS — M25652 Stiffness of left hip, not elsewhere classified: Secondary | ICD-10-CM

## 2020-05-19 DIAGNOSIS — M25651 Stiffness of right hip, not elsewhere classified: Secondary | ICD-10-CM

## 2020-05-19 NOTE — Therapy (Signed)
[facility] [address]  [place], [place], [address] Phone: [phone]   Fax:  [phone]  Physical Therapy Treatment  Patient Details  Name: Kristi Wiley MRN: [id number] Date of Birth: [birth year] Referring Provider (PT): Ninfa Linden   Encounter Date: 05/19/2020   PT End of Session - 05/19/20 1355    Visit Number 2    Number of Visits 17    Date for PT Re-Evaluation 07/07/20    Authorization Type Medicare & BCBS    PT Start Time 1309    PT Stop Time 1354    PT Time Calculation (min) 45 min    Activity Tolerance Patient tolerated treatment well;Patient limited by pain    Behavior During Therapy [facility] for tasks assessed/performed           Past Medical History:  Diagnosis Date  . Arthritis    oa of right hip , left hip , left thumb   . Breast cancer ([place])   . 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  . Elevated cholesterol   . History of right hip replacement 05/17/2018  . Hypertension   . Mitral valve disorder    sicne birth   . Pain of right hip joint 06/19/2017  . 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 Tinnitus   . Unilateral primary osteoarthritis, 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  . Unilateral primary osteoarthritis, 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    Past Surgical History:  Procedure Laterality Date  . ABDOMINAL HYSTERECTOMY  1986  . BREAST SURGERY Right 2000   reconstructive    . CATARACT EXTRACTION, BILATERAL  2015  . eye cosmetic  Bilateral    [place]   . 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 TONSILLECTOMY    . TOTAL HIP ARTHROPLASTY Right 10/06/2017   Procedure: RIGHT TOTAL HIP ARTHROPLASTY ANTERIOR APPROACH;  Surgeon: Mcarthur Rossetti, MD;  Location: [facility];  Service: Orthopedics;  Laterality: Right;  . TOTAL HIP ARTHROPLASTY Left 09/06/2019   Procedure: LEFT TOTAL HIP ARTHROPLASTY ANTERIOR APPROACH;  Surgeon: Mcarthur Rossetti, MD;  Location: [facility];   Service: Orthopedics;  Laterality: Left;  . TOTAL MASTECTOMY Right 01/24/1996    There were no vitals filed for this visit.   Subjective Assessment - 05/19/20 1312    Subjective Reports that at the time of her initial eval she only had N/T in the R foot, however after performing her exercises she is now having N/T in the R calf and foot. Feels that maybe the piriformis stretch caused this. but not sure.    Pertinent History Right THR 2019, left THR July 2021. hysterectomy. Mastectomy 80 yo due to Breast CA    Patient Stated Goals to decrease pain, to get back to walking with decreased pain    Currently in Pain? Yes    Multiple Pain Sites Yes    Pain Score 5    Pain Location Leg   below knee   Pain Orientation Right    Pain Descriptors / Indicators Numbness    Pain Type Acute pain;Chronic pain              [facility] PT Assessment - 05/19/20 0001      Observation/Other Assessments   Focus on Therapeutic Outcomes (FOTO)  Lumbar: 95                         [facility] Adult  PT Treatment/Exercise - 05/19/20 0001      Exercises   Exercises Lumbar      Lumbar Exercises: Stretches   Lower Trunk Rotation Limitations 10x   manual cues to mantain hips/LB on mat   Piriformis Stretch Right;Left;1 rep;30 seconds    Piriformis Stretch Limitations modified ER stretch with folded pillow under knee   cueing for proper hold time, avoiding bouncing, sitting up tall   Other Lumbar Stretch Exercise anterior prayer stretch with green pball 5x5"    Other Lumbar Stretch Exercise B butterfly stretch with gentle OP 30"      Lumbar Exercises: Seated   Sit to Stand 5 reps    Sit to Stand Limitations 2x5; last 5 reps with red TB above knees to avoid valgus collapse    Other Seated Lumbar Exercises sitting march x10   good posture     Lumbar Exercises: Supine   Bridge 10 reps    Bridge Limitations good form                  PT Education - 05/19/20 1354    Education Details update  to HEP- Access Code [email or web address]; review of HEP and edu on importance of HEP compliance    Person(s) Educated Patient    Methods Explanation;Demonstration;Tactile cues;Verbal cues;Handout    Comprehension Verbalized understanding;Returned demonstration            PT Short Term Goals - 05/19/20 1358      PT SHORT TERM GOAL #1   Title Pt will be independent with initial HEP    Time 2    Period Weeks    Status On-going    Target Date 05/26/20             PT Long Term Goals - 05/19/20 1358      PT LONG TERM GOAL #1   Title Pt will demo improved BLE strength to >/= 4+/5    Time 8    Status On-going      PT LONG TERM GOAL #2   Title Pt will demo improved control, safety and symmetry with 5 TSTS test    Time 8    Period Weeks    Status On-going      PT LONG TERM GOAL #3   Title Pt will demonstrate improved thoracolumbar rotation ROM with </= 2/10 pain to facilitate safe and comfortable trunk rotation for ADLs and driving    Time 8    Period Weeks    Status On-going      PT LONG TERM GOAL #4   Title Pt will report </= 2/10 low back and leg pain with prolonged walking in community    Time 8    Period Weeks    Status On-going                 Plan - 05/19/20 1356    Clinical Impression Statement Patient arrived to session with report of return of N/T in the R calf since performing her HEP. Reviewed HEP with cueing to avoid compensations. Patient with confusion about proper number of reps and hold times, thus this was covered in depth. Patient noting discomfort in B hips when trying to perform piriformis stretch at home- provided modifications and corrected form for improved tolerance. Offered supine butterfly stretch for more comfortable alternative. Patient with L weight shift avoidance and valgus collapse with STS, which slightly improved with banded resistance. Patient tolerated session without complaints today and reported understanding of HEP  update.    Comorbidities  HTN, Arthitis, Hx breast CA    PT Treatment/Interventions ADLs/Self Care Home Management;Electrical Stimulation;Iontophoresis 4mg /ml Dexamethasone;Moist Heat;Traction;Cryotherapy;Gait training;Neuromuscular re-education;Balance training;Therapeutic exercise;Therapeutic activities;Functional mobility training;Patient/family education;Manual techniques;Energy conservation;Taping    PT Next Visit Plan Further assess balance next visit. Postural re-ed and strengthening. LE and spinal mobility/flexibility, progressive TE as tolerated. Manual and modalities as tolerated.    Consulted and Agree with Plan of Care Patient           Patient will benefit from skilled therapeutic intervention in order to improve the following deficits and impairments:  Abnormal gait,Decreased range of motion,Decreased endurance,Pain,Improper body mechanics,Impaired flexibility,Hypomobility,Decreased balance,Decreased mobility,Decreased strength,Impaired sensation,Postural dysfunction  Visit Diagnosis: Abnormal posture  Muscle weakness (generalized)  Low back pain, unspecified back pain laterality, unspecified chronicity, unspecified whether sciatica present  Unspecified disturbances of skin sensation  Pain in right hip  Stiffness of right hip, not elsewhere classified  Stiffness of left hip, not elsewhere classified  Pain in left hip     Problem List Patient Active Problem List   Diagnosis Date Noted  . Status post total replacement of left hip 09/06/2019  . History of right hip replacement 05/17/2018  . 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 Pain of right hip joint 06/19/2017  . Unilateral primary osteoarthritis, 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  . Unilateral primary osteoarthritis, 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  . 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     Janene Harvey, PT, DPT 05/19/20 2:00 PM   [facility] [address]  [place], [place], [address] Phone: [phone]   Fax:  [phone]  Name: Kristi Wiley MRN: [id number] Date of Birth: 14-Oct-1940

## 2020-05-21 ENCOUNTER — Other Ambulatory Visit: Payer: Self-pay

## 2020-05-21 ENCOUNTER — Ambulatory Visit: Payer: Medicare Other

## 2020-05-21 DIAGNOSIS — M6281 Muscle weakness (generalized): Secondary | ICD-10-CM

## 2020-05-21 DIAGNOSIS — M25551 Pain in right hip: Secondary | ICD-10-CM

## 2020-05-21 DIAGNOSIS — M25651 Stiffness of right hip, not elsewhere classified: Secondary | ICD-10-CM

## 2020-05-21 DIAGNOSIS — M25652 Stiffness of left hip, not elsewhere classified: Secondary | ICD-10-CM

## 2020-05-21 DIAGNOSIS — M545 Low back pain, unspecified: Secondary | ICD-10-CM

## 2020-05-21 DIAGNOSIS — R209 Unspecified disturbances of skin sensation: Secondary | ICD-10-CM

## 2020-05-21 DIAGNOSIS — M25552 Pain in left hip: Secondary | ICD-10-CM

## 2020-05-21 DIAGNOSIS — R293 Abnormal posture: Secondary | ICD-10-CM

## 2020-05-21 NOTE — Therapy (Signed)
[facility] [address]  [place], [place], [address] Phone: [phone]   Fax:  [phone]  Physical Therapy Treatment  Patient Details  Name: Kristi Wiley MRN: [id number] Date of Birth: 1940-11-08 Referring Provider (PT): Ninfa Linden   Encounter Date: 05/21/2020   PT End of Session - 05/21/20 1700    Visit Number 3    Number of Visits 17    Date for PT Re-Evaluation 07/07/20    Authorization Type Medicare & BCBS    PT Start Time 1309    PT Stop Time 1749    PT Time Calculation (min) 38 min    Activity Tolerance Patient tolerated treatment well;Patient limited by pain    Behavior During Therapy [facility] for tasks assessed/performed           Past Medical History:  Diagnosis Date  . Arthritis    oa of right hip , left hip , left thumb   . Breast cancer ([place])   . 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  . Elevated cholesterol   . History of right hip replacement 05/17/2018  . Hypertension   . Mitral valve disorder    sicne birth   . Pain of right hip joint 06/19/2017  . 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 Tinnitus   . Unilateral primary osteoarthritis, 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  . Unilateral primary osteoarthritis, 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    Past Surgical History:  Procedure Laterality Date  . ABDOMINAL HYSTERECTOMY  1986  . BREAST SURGERY Right 2000   reconstructive    . CATARACT EXTRACTION, BILATERAL  2015  . eye cosmetic  Bilateral    [place]   . 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 TONSILLECTOMY    . TOTAL HIP ARTHROPLASTY Right 10/06/2017   Procedure: RIGHT TOTAL HIP ARTHROPLASTY ANTERIOR APPROACH;  Surgeon: Mcarthur Rossetti, MD;  Location: [facility];  Service: Orthopedics;  Laterality: Right;  . TOTAL HIP ARTHROPLASTY Left 09/06/2019   Procedure: LEFT TOTAL HIP ARTHROPLASTY ANTERIOR APPROACH;  Surgeon: Mcarthur Rossetti, MD;  Location: [facility];   Service: Orthopedics;  Laterality: Left;  . TOTAL MASTECTOMY Right 01/24/1996    There were no vitals filed for this visit.   Subjective Assessment - 05/21/20 1320    Subjective Continued  N/T in the R calf and foot. Feels that maybe the piriformis stretch caused this, not doing it on he right anymore      Pertinent History Right THR 2019, left THR July 2021. hysterectomy. Mastectomy 80 yo due to Breast CA    How long can you sit comfortably? unlimited    How long can you walk comfortably? 5-10 minutes    Patient Stated Goals to decrease pain, to get back to walking with decreased pain    Currently in Pain? Yes    Pain Descriptors / Indicators Numbness                             [facility] Adult PT Treatment/Exercise - 05/21/20 0001      Lumbar Exercises: Stretches   Lower Trunk Rotation Limitations 10x    Pelvic Tilt 20 reps   2 sets x 10 in hooklying   Lumbar Stabilization Level 1 --   bent knee fallouts 10 reps x 2   Other Lumbar Stretch Exercise anterior prayer stretch with green pball 5x5"  Other Lumbar Stretch Exercise B butterfly stretch with gentle OP 30" x 3      Lumbar Exercises: Aerobic   Nustep L2 x 5 min      Lumbar Exercises: Seated   Sit to Stand 10 reps   red TB   Other Seated Lumbar Exercises sitting march x10      Lumbar Exercises: Supine   Bridge 10 reps    Bridge Limitations good form                  PT Education - 05/21/20 1338    Education Details reinforced updated HEP Access Code [email or web address] compliance from previous session. Added supine core stabilization with PPT and bent knee fallouts with good understadning and performance in session.    Person(s) Educated Patient    Methods Explanation;Demonstration;Handout    Comprehension Verbalized understanding;Returned demonstration            PT Short Term Goals - 05/19/20 1358      PT SHORT TERM GOAL #1   Title Pt will be independent with initial HEP    Time 2    Period  Weeks    Status On-going    Target Date 05/26/20             PT Long Term Goals - 05/19/20 1358      PT LONG TERM GOAL #1   Title Pt will demo improved BLE strength to >/= 4+/5    Time 8    Status On-going      PT LONG TERM GOAL #2   Title Pt will demo improved control, safety and symmetry with 5 TSTS test    Time 8    Period Weeks    Status On-going      PT LONG TERM GOAL #3   Title Pt will demonstrate improved thoracolumbar rotation ROM with </= 2/10 pain to facilitate safe and comfortable trunk rotation for ADLs and driving    Time 8    Period Weeks    Status On-going      PT LONG TERM GOAL #4   Title Pt will report </= 2/10 low back and leg pain with prolonged walking in community    Time 8    Period Weeks    Status On-going                 Plan - 05/21/20 1348    Clinical Impression Statement Pt arrived to session today reporting improved tolerance to updated exercises from last session. continued  calf/foot numbness, overall feeling weak. Improved understanding of holds with stretches noted. Improved symmetry of WS and decreased valgus collapse noted with STS today. initiated more lumbar core stabilization exercises today with good performance and core engagement, mostdifficulty noted with maintianing core stability with left bent knee fallout requiring tactile and verbal cues to prevent lower trunk rotation/right hip from coming off of mat table. She did ncely with these after cueing and asked if these could be added to her exercise program today. She was advised to update PT next visit regarding HEP tolerance with additions, and she was encouraged to feel comfortable asking for modifications with exercises if needed.    Personal Factors and Comorbidities Age;Past/Current Experience;Time since onset of injury/illness/exacerbation;Fitness;Comorbidity 3+    Comorbidities HTN, Arthitis, Hx breast CA    Examination-Activity Limitations Locomotion  Level;Stairs;Squat;Stand    Examination-Participation Restrictions Meal Prep;Community Activity;Driving;Interpersonal Relationship    Rehab Potential Good    PT Treatment/Interventions ADLs/Self Care Home Management;Electrical  Stimulation;Iontophoresis 4mg /ml Dexamethasone;Moist Heat;Traction;Cryotherapy;Gait training;Neuromuscular re-education;Balance training;Therapeutic exercise;Therapeutic activities;Functional mobility training;Patient/family education;Manual techniques;Energy conservation;Taping    PT Next Visit Plan Further assess balance next visit. Postural re-ed and strengthening. LE and spinal mobility/flexibility, progressive TE as tolerated. Progress lumbar stabilization exercises as tolerated. Manual and modalities as needed.    PT Home Exercise Plan Initial HEP: [email or web address]    Consulted and Agree with Plan of Care Patient           Patient will benefit from skilled therapeutic intervention in order to improve the following deficits and impairments:  Abnormal gait,Decreased range of motion,Decreased endurance,Pain,Improper body mechanics,Impaired flexibility,Hypomobility,Decreased balance,Decreased mobility,Decreased strength,Impaired sensation,Postural dysfunction  Visit Diagnosis: Muscle weakness (generalized)  Abnormal posture  Low back pain, unspecified back pain laterality, unspecified chronicity, unspecified whether sciatica present  Unspecified disturbances of skin sensation  Pain in right hip  Stiffness of right hip, not elsewhere classified  Stiffness of left hip, not elsewhere classified  Pain in left hip     Problem List Patient Active Problem List   Diagnosis Date Noted  . Status post total replacement of left hip 09/06/2019  . History of right hip replacement 05/17/2018  . 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 Pain of right hip joint 06/19/2017  . Unilateral primary osteoarthritis, 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  . Unilateral primary  osteoarthritis, 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  . 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    Hall Busing , PT, DPT 05/21/2020, 1:56 PM  [facility] [address]  [place], [place], [address] Phone: [phone]   Fax:  [phone]  Name: Kristi Wiley MRN: [id number] Date of Birth: [birth year]

## 2020-05-26 ENCOUNTER — Ambulatory Visit: Payer: Medicare Other | Attending: Orthopaedic Surgery

## 2020-05-26 ENCOUNTER — Other Ambulatory Visit: Payer: Self-pay

## 2020-05-26 DIAGNOSIS — M25651 Stiffness of right hip, not elsewhere classified: Secondary | ICD-10-CM | POA: Diagnosis present

## 2020-05-26 DIAGNOSIS — M25552 Pain in left hip: Secondary | ICD-10-CM | POA: Diagnosis present

## 2020-05-26 DIAGNOSIS — M25652 Stiffness of left hip, not elsewhere classified: Secondary | ICD-10-CM | POA: Insufficient documentation

## 2020-05-26 DIAGNOSIS — R209 Unspecified disturbances of skin sensation: Secondary | ICD-10-CM | POA: Insufficient documentation

## 2020-05-26 DIAGNOSIS — R293 Abnormal posture: Secondary | ICD-10-CM | POA: Diagnosis present

## 2020-05-26 DIAGNOSIS — M545 Low back pain, unspecified: Secondary | ICD-10-CM | POA: Insufficient documentation

## 2020-05-26 DIAGNOSIS — M25551 Pain in right hip: Secondary | ICD-10-CM | POA: Insufficient documentation

## 2020-05-26 DIAGNOSIS — M6281 Muscle weakness (generalized): Secondary | ICD-10-CM | POA: Diagnosis present

## 2020-05-26 NOTE — Patient Instructions (Signed)
Access Code: [email or web address] URL: [email or web address] Date: 05/26/2020 Prepared by: Clarene Essex  Exercises Hooklying Clamshell with Resistance - 1 x daily - 7 x weekly - 2 sets - 10 reps - 3 hold

## 2020-05-26 NOTE — Therapy (Signed)
[facility] [address]  [place], [place], [address] Phone: [phone]   Fax:  [phone]  Physical Therapy Treatment  Patient Details  Name: Renata Gambino Mikrut MRN: [id number] Date of Birth: [birth year] Referring Provider (PT): Ninfa Linden   Encounter Date: 05/26/2020   PT End of Session - 05/26/20 1207    Visit Number 4    Number of Visits 17    Date for PT Re-Evaluation 07/07/20    Authorization Type Medicare & BCBS    PT Start Time 1101    PT Stop Time 1144    PT Time Calculation (min) 43 min    Activity Tolerance Patient tolerated treatment well    Behavior During Therapy [facility] for tasks assessed/performed           Past Medical History:  Diagnosis Date  . Arthritis    oa of right hip , left hip , left thumb   . Breast cancer ([place])   . 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  . Elevated cholesterol   . History of right hip replacement 05/17/2018  . Hypertension   . Mitral valve disorder    sicne birth   . Pain of right hip joint 06/19/2017  . 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 Tinnitus   . Unilateral primary osteoarthritis, 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  . Unilateral primary osteoarthritis, 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    Past Surgical History:  Procedure Laterality Date  . ABDOMINAL HYSTERECTOMY  1986  . BREAST SURGERY Right 2000   reconstructive    . CATARACT EXTRACTION, BILATERAL  2015  . eye cosmetic  Bilateral    [place]   . 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 TONSILLECTOMY    . TOTAL HIP ARTHROPLASTY Right 10/06/2017   Procedure: RIGHT TOTAL HIP ARTHROPLASTY ANTERIOR APPROACH;  Surgeon: Mcarthur Rossetti, MD;  Location: [facility];  Service: Orthopedics;  Laterality: Right;  . TOTAL HIP ARTHROPLASTY Left 09/06/2019   Procedure: LEFT TOTAL HIP ARTHROPLASTY ANTERIOR APPROACH;  Surgeon: Mcarthur Rossetti, MD;  [facility];  Service: Orthopedics;   Laterality: Left;  . TOTAL MASTECTOMY Right 01/24/1996    There were no vitals filed for this visit.   Subjective Assessment - 05/26/20 1107    Subjective Pt reports that she is doing good with no new complaints.    Pertinent History Right THR 2019, left THR July 2021. hysterectomy. Mastectomy 80 yo due to Breast CA    Patient Stated Goals to decrease pain, to get back to walking with decreased pain    Currently in Pain? No/denies   numbness in R lower calf                            [facility] Adult PT Treatment/Exercise - 05/26/20 0001      Exercises   Exercises Lumbar;Knee/Hip      Lumbar Exercises: Aerobic   Nustep L2x 83min      Lumbar Exercises: Supine   Clam 20 reps;3 seconds    Clam Limitations R tband    Bridge 20 reps    Bridge Limitations Rtband    Other Supine Lumbar Exercises pedals with TrA activation, B and unilateral 10 reps   pt became fatigued     Lumbar Exercises: Quadruped   Madcat/Old Horse 10 reps    Madcat/Old Horse Limitations cues for technique  Knee/Hip Exercises: Standing   Hip Flexion Stengthening;Both;2 sets;Knee straight;20 reps    Hip Flexion Limitations R tband around knees    Hip Abduction Stengthening;Both;2 sets;10 reps;Knee straight    Abduction Limitations R tband around knees    Hip Extension Stengthening;Both;2 sets;10 reps    Extension Limitations R tband around knees                  PT Education - 05/26/20 1206    Education Details Updated HEP Access Code: [email or web address]    Person(s) Educated Patient    Methods Explanation;Demonstration;Handout;Verbal cues    Comprehension Verbalized understanding;Returned demonstration            PT Short Term Goals - 05/19/20 1358      PT SHORT TERM GOAL #1   Title Pt will be independent with initial HEP    Time 2    Period Weeks    Status On-going    Target Date 05/26/20             PT Long Term Goals - 05/19/20 1358      PT LONG TERM GOAL #1   Title  Pt will demo improved BLE strength to >/= 4+/5    Time 8    Status On-going      PT LONG TERM GOAL #2   Title Pt will demo improved control, safety and symmetry with 5 TSTS test    Time 8    Period Weeks    Status On-going      PT LONG TERM GOAL #3   Title Pt will demonstrate improved thoracolumbar rotation ROM with </= 2/10 pain to facilitate safe and comfortable trunk rotation for ADLs and driving    Time 8    Period Weeks    Status On-going      PT LONG TERM GOAL #4   Title Pt will report </= 2/10 low back and leg pain with prolonged walking in community    Time 8    Period Weeks    Status On-going                 Plan - 05/26/20 1207    Clinical Impression Statement Pt responded well, she noted that she needed to work more on strengthening her abdominals and core with the added exercises.  Added standing hip exercises to increase pt tolerance to standing positions and to strenthen supporting hip muscles. Added supine clams and bridges with R tband to HEP, written instructions given to pt and demonstration. Pt had no complaints or reports of pain during session. Pt did become fatigued with some of the progressed supine exercises and needed cueing for TrA activation for stability.    Personal Factors and Comorbidities Age;Past/Current Experience;Time since onset of injury/illness/exacerbation;Fitness;Comorbidity 3+    Comorbidities HTN, Arthitis, Hx breast CA    PT Frequency 2x / week    PT Duration 8 weeks    PT Treatment/Interventions ADLs/Self Care Home Management;Electrical Stimulation;Iontophoresis 4mg /ml Dexamethasone;Moist Heat;Traction;Cryotherapy;Gait training;Neuromuscular re-education;Balance training;Therapeutic exercise;Therapeutic activities;Functional mobility training;Patient/family education;Manual techniques;Energy conservation;Taping    PT Next Visit Plan Further assess balance next visit. Postural re-ed and strengthening. LE and spinal mobility/flexibility,  progressive TE as tolerated. Manual and modalities as tolerated.    PT Home Exercise Plan Initial HEP: [email or web address]    Consulted and Agree with Plan of Care Patient           Patient will benefit from skilled therapeutic intervention in order to improve the following deficits and impairments:  Abnormal gait,Decreased range of motion,Decreased endurance,Pain,Improper body mechanics,Impaired flexibility,Hypomobility,Decreased balance,Decreased mobility,Decreased strength,Impaired sensation,Postural dysfunction  Visit Diagnosis: Abnormal posture  Muscle weakness (generalized)  Low back pain, unspecified back pain laterality, unspecified chronicity, unspecified whether sciatica present  Unspecified disturbances of skin sensation  Pain in right hip  Stiffness of right hip, not elsewhere classified     Problem List Patient Active Problem List   Diagnosis Date Noted  . Status post total replacement of left hip 09/06/2019  . History of right hip replacement 05/17/2018  . 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 Pain of right hip joint 06/19/2017  . Unilateral primary osteoarthritis, 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  . Unilateral primary osteoarthritis, 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  . 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    Artist Pais, PTA 05/26/2020, 12:16 PM  [facility] [address]  [place], [place], [address] Phone: [phone]   Fax:  [phone]  Name: Jody Aguinaga Nott MRN: [id number] Date of Birth: [birth year]

## 2020-05-29 ENCOUNTER — Ambulatory Visit: Payer: Medicare Other | Admitting: Physical Therapy

## 2020-05-29 ENCOUNTER — Encounter: Payer: Self-pay | Admitting: Physical Therapy

## 2020-05-29 ENCOUNTER — Other Ambulatory Visit: Payer: Self-pay

## 2020-05-29 DIAGNOSIS — M545 Low back pain, unspecified: Secondary | ICD-10-CM

## 2020-05-29 DIAGNOSIS — M6281 Muscle weakness (generalized): Secondary | ICD-10-CM

## 2020-05-29 DIAGNOSIS — R209 Unspecified disturbances of skin sensation: Secondary | ICD-10-CM

## 2020-05-29 DIAGNOSIS — M25651 Stiffness of right hip, not elsewhere classified: Secondary | ICD-10-CM

## 2020-05-29 DIAGNOSIS — R293 Abnormal posture: Secondary | ICD-10-CM | POA: Diagnosis not present

## 2020-05-29 DIAGNOSIS — M25552 Pain in left hip: Secondary | ICD-10-CM

## 2020-05-29 DIAGNOSIS — M25551 Pain in right hip: Secondary | ICD-10-CM

## 2020-05-29 DIAGNOSIS — M25652 Stiffness of left hip, not elsewhere classified: Secondary | ICD-10-CM

## 2020-05-29 NOTE — Therapy (Signed)
[facility] [address]  [place], [place], [address] Phone: [phone]   Fax:  [phone]  Physical Therapy Treatment  Patient Details  Name: Kristi Wiley MRN: [id number] Date of Birth: [birth year] Referring Provider (PT): Ninfa Linden   Encounter Date: 05/29/2020   PT End of Session - 05/29/20 1013    Visit Number 5    Number of Visits 17    Date for PT Re-Evaluation 07/07/20    Authorization Type Medicare & BCBS    PT Start Time 0930    PT Stop Time 1012    PT Time Calculation (min) 42 min    Activity Tolerance Patient tolerated treatment well    Behavior During Therapy [facility] for tasks assessed/performed           Past Medical History:  Diagnosis Date  . Arthritis    oa of right hip , left hip , left thumb   . Breast cancer ([place])   . 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  . Elevated cholesterol   . History of right hip replacement 05/17/2018  . Hypertension   . Mitral valve disorder    sicne birth   . Pain of right hip joint 06/19/2017  . 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 Tinnitus   . Unilateral primary osteoarthritis, 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  . Unilateral primary osteoarthritis, 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    Past Surgical History:  Procedure Laterality Date  . ABDOMINAL HYSTERECTOMY  1986  . BREAST SURGERY Right 2000   reconstructive    . CATARACT EXTRACTION, BILATERAL  2015  . eye cosmetic  Bilateral    [place]   . 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 TONSILLECTOMY    . TOTAL HIP ARTHROPLASTY Right 10/06/2017   Procedure: RIGHT TOTAL HIP ARTHROPLASTY ANTERIOR APPROACH;  Surgeon: Mcarthur Rossetti, MD;  [facility];  Service: Orthopedics;  Laterality: Right;  . TOTAL HIP ARTHROPLASTY Left 09/06/2019   Procedure: LEFT TOTAL HIP ARTHROPLASTY ANTERIOR APPROACH;  Surgeon: Mcarthur Rossetti, MD;  [facility];  Service: Orthopedics;   Laterality: Left;  . TOTAL MASTECTOMY Right 01/24/1996    There were no vitals filed for this visit.   Subjective Assessment - 05/29/20 0934    Subjective The 2 exercises that she was given last week makes her legs weak.    Pertinent History Right THR 2019, left THR July 2021. hysterectomy. Mastectomy 80 yo due to Breast CA    Patient Stated Goals to decrease pain, to get back to walking with decreased pain    Currently in Pain? No/denies                             [facility] Adult PT Treatment/Exercise - 05/29/20 0001      Lumbar Exercises: Aerobic   Nustep L3x 52min (UEs/LEs)      Lumbar Exercises: Standing   Wall Slides 10 reps    Wall Slides Limitations good form and tolerance      Lumbar Exercises: Supine   Other Supine Lumbar Exercises alt bent knee fallout with yellow TB 10x each   cues for core contraction     Lumbar Exercises: Sidelying   Clam Right;Left;10 reps    Clam Limitations yellow TB   manual cues to avoid trunk rotation with R   Other Sidelying Lumbar Exercises R/L open book  stretch 10x each   cues for positioning     Knee/Hip Exercises: Standing   Hip Abduction Stengthening;Both;10 reps;Knee straight;1 set    Abduction Limitations R tband around knees   good form                 PT Education - 05/29/20 1012    Education Details verbal review of HEP and condolidation; Access Code [email or web address]    Person(s) Educated Patient    Methods Explanation;Demonstration;Tactile cues;Verbal cues;Handout    Comprehension Verbalized understanding            PT Short Term Goals - 05/29/20 1014      PT SHORT TERM GOAL #1   Title Pt will be independent with initial HEP    Time 2    Period Weeks    Status Achieved    Target Date 05/26/20             PT Long Term Goals - 05/19/20 1358      PT LONG TERM GOAL #1   Title Pt will demo improved BLE strength to >/= 4+/5    Time 8    Status On-going      PT LONG TERM GOAL #2   Title Pt will  demo improved control, safety and symmetry with 5 TSTS test    Time 8    Period Weeks    Status On-going      PT LONG TERM GOAL #3   Title Pt will demonstrate improved thoracolumbar rotation ROM with </= 2/10 pain to facilitate safe and comfortable trunk rotation for ADLs and driving    Time 8    Period Weeks    Status On-going      PT LONG TERM GOAL #4   Title Pt will report </= 2/10 low back and leg pain with prolonged walking in community    Time 8    Period Weeks    Status On-going                 Plan - 05/29/20 1013    Clinical Impression Statement Patient arrived to session with report of sensation of weakness in B LEs from last HEP given. Patient expresses that she has been given too many exercises and will not perform them at home if she has too many. Thus, verbally reviewed and consolidated HEP for max benefit and compliance. Patient reported good benefit from this review. Proceeded with gentle hip strengthening with patient demonstrating more weakness R>L. Thoracolumbar mobility was performed with cues to maintain proper form throughout. Patient reported no complaints at end of session.    Personal Factors and Comorbidities Age;Past/Current Experience;Time since onset of injury/illness/exacerbation;Fitness;Comorbidity 3+    Comorbidities HTN, Arthitis, Hx breast CA    PT Frequency 2x / week    PT Duration 8 weeks    PT Treatment/Interventions ADLs/Self Care Home Management;Electrical Stimulation;Iontophoresis 4mg /ml Dexamethasone;Moist Heat;Traction;Cryotherapy;Gait training;Neuromuscular re-education;Balance training;Therapeutic exercise;Therapeutic activities;Functional mobility training;Patient/family education;Manual techniques;Energy conservation;Taping    PT Next Visit Plan Further assess balance next visit. Postural re-ed and strengthening. LE and spinal mobility/flexibility, progressive TE as tolerated. Manual and modalities as tolerated.    PT Home Exercise Plan  Initial HEP: [email or web address]    Consulted and Agree with Plan of Care Patient           Patient will benefit from skilled therapeutic intervention in order to improve the following deficits and impairments:  Abnormal gait,Decreased range of motion,Decreased endurance,Pain,Improper body mechanics,Impaired flexibility,Hypomobility,Decreased balance,Decreased mobility,Decreased strength,Impaired sensation,Postural dysfunction  Visit Diagnosis: Abnormal posture  Muscle weakness (generalized)  Low back pain, unspecified back pain laterality, unspecified chronicity, unspecified whether sciatica present  Unspecified disturbances of skin sensation  Pain in right hip  Stiffness of right hip, not elsewhere classified  Stiffness of left hip, not elsewhere classified  Pain in left hip     Problem List Patient Active Problem List   Diagnosis Date Noted  . Status post total replacement of left hip 09/06/2019  . History of right hip replacement 05/17/2018  . 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 Pain of right hip joint 06/19/2017  . Unilateral primary osteoarthritis, 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  . Unilateral primary osteoarthritis, 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  . 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     Janene Harvey, PT, DPT 05/29/20 10:16 AM   [facility] [address]  [place], [place], [address] Phone: [phone]   Fax:  [phone]  Name: Kristi Wiley MRN: [id number] Date of Birth: June 24, 1940

## 2020-06-04 ENCOUNTER — Other Ambulatory Visit: Payer: Self-pay

## 2020-06-04 ENCOUNTER — Ambulatory Visit: Payer: Medicare Other | Admitting: Physical Therapy

## 2020-06-04 ENCOUNTER — Encounter: Payer: Self-pay | Admitting: Physical Therapy

## 2020-06-04 DIAGNOSIS — M6281 Muscle weakness (generalized): Secondary | ICD-10-CM

## 2020-06-04 DIAGNOSIS — M25552 Pain in left hip: Secondary | ICD-10-CM

## 2020-06-04 DIAGNOSIS — R293 Abnormal posture: Secondary | ICD-10-CM

## 2020-06-04 DIAGNOSIS — M25652 Stiffness of left hip, not elsewhere classified: Secondary | ICD-10-CM

## 2020-06-04 DIAGNOSIS — M25651 Stiffness of right hip, not elsewhere classified: Secondary | ICD-10-CM

## 2020-06-04 DIAGNOSIS — R209 Unspecified disturbances of skin sensation: Secondary | ICD-10-CM

## 2020-06-04 DIAGNOSIS — M25551 Pain in right hip: Secondary | ICD-10-CM

## 2020-06-04 DIAGNOSIS — M545 Low back pain, unspecified: Secondary | ICD-10-CM

## 2020-06-04 NOTE — Therapy (Signed)
[facility] [address]  [place], [place], [address] Phone: [phone]   Fax:  [phone]  Physical Therapy Treatment  Patient Details  Name: Kristi Wiley MRN: [id number] Date of Birth: 08-Dec-1940 Referring Provider (PT): Ninfa Linden   Encounter Date: 06/04/2020   PT End of Session - 06/04/20 1739    Visit Number 6    Number of Visits 17    Date for PT Re-Evaluation 07/07/20    Authorization Type Medicare & BCBS    PT Start Time 1657    PT Stop Time 1739    PT Time Calculation (min) 42 min    Activity Tolerance Patient tolerated treatment well;Patient limited by pain    Behavior During Therapy [facility] for tasks assessed/performed           Past Medical History:  Diagnosis Date  . Arthritis    oa of right hip , left hip , left thumb   . Breast cancer ([place])   . 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  . Elevated cholesterol   . History of right hip replacement 05/17/2018  . Hypertension   . Mitral valve disorder    sicne birth   . Pain of right hip joint 06/19/2017  . 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 Tinnitus   . Unilateral primary osteoarthritis, 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  . Unilateral primary osteoarthritis, 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    Past Surgical History:  Procedure Laterality Date  . ABDOMINAL HYSTERECTOMY  1986  . BREAST SURGERY Right 2000   reconstructive    . CATARACT EXTRACTION, BILATERAL  2015  . eye cosmetic  Bilateral    [place]   . 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 TONSILLECTOMY    . TOTAL HIP ARTHROPLASTY Right 10/06/2017   Procedure: RIGHT TOTAL HIP ARTHROPLASTY ANTERIOR APPROACH;  Surgeon: Mcarthur Rossetti, MD;  Location: [facility];  Service: Orthopedics;  Laterality: Right;  . TOTAL HIP ARTHROPLASTY Left 09/06/2019   Procedure: LEFT TOTAL HIP ARTHROPLASTY ANTERIOR APPROACH;  Surgeon: Mcarthur Rossetti, MD;  Location: [facility];   Service: Orthopedics;  Laterality: Left;  . TOTAL MASTECTOMY Right 01/24/1996    There were no vitals filed for this visit.   Subjective Assessment - 06/04/20 1658    Subjective Has been feeling okay. Reports that the new HEP is working out well.    Pertinent History Right THR 2019, left THR July 2021. hysterectomy. Mastectomy 80 yo due to Breast CA    Patient Stated Goals to decrease pain, to get back to walking with decreased pain    Currently in Pain? No/denies                             [facility] Adult PT Treatment/Exercise - 06/04/20 0001      Lumbar Exercises: Aerobic   Nustep L4x 50min (UEs/LEs)      Lumbar Exercises: Standing   Wall Slides 10 reps    Wall Slides Limitations red TB above knees    Other Standing Lumbar Exercises R/L 4 way hip with 2 ski poles 10x each   cueing to avoid excessive UE support, maintain TKE; supervision provided for safety d/t slight instability     Lumbar Exercises: Supine   Bridge with clamshell 10 reps   red TB   Isometric Hip Flexion 5 reps;5 seconds    Isometric  Hip Flexion Limitations isometric hip flexion with LEs elevated on orange pball 5x5"   cues to avoid valsalva   Other Supine Lumbar Exercises alternating pedals with TrA activation 10x    Other Supine Lumbar Exercises alt bent knee fallout with yellow TB 10x each   cues to maintain static hip stable     Knee/Hip Exercises: Stretches   Other Knee/Hip Stretches B butterfly stretch 2x30"      Knee/Hip Exercises: Standing   Hip Abduction Stengthening;Both;10 reps;Knee straight;1 set    Abduction Limitations yellow TB above knees wiht 2 ski poles      Knee/Hip Exercises: Sidelying   Hip ABduction Strengthening;Right;Left;1 set;10 reps    Hip ABduction Limitations good alignment   unable to R LE d/t pain   Hip ADduction Strengthening;Right;Left;1 set;10 reps    Hip ADduction Limitations opposite LE elevtaed on bolster   unable to L LE d/t pain                    PT Short Term Goals - 05/29/20 1014      PT SHORT TERM GOAL #1   Title Pt will be independent with initial HEP    Time 2    Period Weeks    Status Achieved    Target Date 05/26/20             PT Long Term Goals - 05/19/20 1358      PT LONG TERM GOAL #1   Title Pt will demo improved BLE strength to >/= 4+/5    Time 8    Status On-going      PT LONG TERM GOAL #2   Title Pt will demo improved control, safety and symmetry with 5 TSTS test    Time 8    Period Weeks    Status On-going      PT LONG TERM GOAL #3   Title Pt will demonstrate improved thoracolumbar rotation ROM with </= 2/10 pain to facilitate safe and comfortable trunk rotation for ADLs and driving    Time 8    Period Weeks    Status On-going      PT LONG TERM GOAL #4   Title Pt will report </= 2/10 low back and leg pain with prolonged walking in community    Time 8    Period Weeks    Status On-going                 Plan - 06/04/20 1740    Clinical Impression Statement Patient reporting that her new consolidated HEP is working out well. Worked on progressive hip and core strengthening using mat. Minor corrections for foot placement and alignment required. Attempted sidelying hip strengthening with patient reporting sharp pain over R lateral hip with abduction and L hip adduction, thus this was discontinued. Opted for standing 4 way hip with slightly decreased UE support, which patient tolerated better, but did demonstrate slight unsteadiness. No complaints at end of session. Patient continues to demonstrate R hip weakness, but with slightly improved tolerance for ther-ex with subsequent sessions.    Personal Factors and Comorbidities Age;Past/Current Experience;Time since onset of injury/illness/exacerbation;Fitness;Comorbidity 3+    Comorbidities HTN, Arthitis, Hx breast CA    PT Frequency 2x / week    PT Duration 8 weeks    PT Treatment/Interventions ADLs/Self Care Home  Management;Electrical Stimulation;Iontophoresis 4mg /ml Dexamethasone;Moist Heat;Traction;Cryotherapy;Gait training;Neuromuscular re-education;Balance training;Therapeutic exercise;Therapeutic activities;Functional mobility training;Patient/family education;Manual techniques;Energy conservation;Taping    PT Next Visit Plan Postural re-ed and  strengthening. LE and spinal mobility/flexibility, progressive TE as tolerated. Manual and modalities as tolerated.    PT Home Exercise Plan Initial HEP: [email or web address]    Consulted and Agree with Plan of Care Patient           Patient will benefit from skilled therapeutic intervention in order to improve the following deficits and impairments:  Abnormal gait,Decreased range of motion,Decreased endurance,Pain,Improper body mechanics,Impaired flexibility,Hypomobility,Decreased balance,Decreased mobility,Decreased strength,Impaired sensation,Postural dysfunction  Visit Diagnosis: Abnormal posture  Muscle weakness (generalized)  Low back pain, unspecified back pain laterality, unspecified chronicity, unspecified whether sciatica present  Unspecified disturbances of skin sensation  Pain in right hip  Stiffness of right hip, not elsewhere classified  Stiffness of left hip, not elsewhere classified  Pain in left hip     Problem List Patient Active Problem List   Diagnosis Date Noted  . Status post total replacement of left hip 09/06/2019  . History of right hip replacement 05/17/2018  . 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 Pain of right hip joint 06/19/2017  . Unilateral primary osteoarthritis, 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  . Unilateral primary osteoarthritis, 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  . 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     Janene Harvey, PT, DPT 06/04/20 5:44 PM   [facility] [address]  [place], [place], [address] Phone:  [phone]   Fax:  [phone]  Name: Kristi Wiley MRN: [id number] Date of Birth: 22-Apr-1940

## 2020-06-09 ENCOUNTER — Encounter: Payer: Self-pay | Admitting: Physical Therapy

## 2020-06-09 ENCOUNTER — Other Ambulatory Visit: Payer: Self-pay

## 2020-06-09 ENCOUNTER — Ambulatory Visit: Payer: Medicare Other | Admitting: Physical Therapy

## 2020-06-09 DIAGNOSIS — R293 Abnormal posture: Secondary | ICD-10-CM

## 2020-06-09 DIAGNOSIS — M25552 Pain in left hip: Secondary | ICD-10-CM

## 2020-06-09 DIAGNOSIS — M545 Low back pain, unspecified: Secondary | ICD-10-CM

## 2020-06-09 DIAGNOSIS — M25652 Stiffness of left hip, not elsewhere classified: Secondary | ICD-10-CM

## 2020-06-09 DIAGNOSIS — M25651 Stiffness of right hip, not elsewhere classified: Secondary | ICD-10-CM

## 2020-06-09 DIAGNOSIS — M25551 Pain in right hip: Secondary | ICD-10-CM

## 2020-06-09 DIAGNOSIS — M6281 Muscle weakness (generalized): Secondary | ICD-10-CM

## 2020-06-09 DIAGNOSIS — R209 Unspecified disturbances of skin sensation: Secondary | ICD-10-CM

## 2020-06-09 NOTE — Therapy (Signed)
[facility] [address]  [place], [place], [address] Phone: [phone]   Fax:  [phone]  Physical Therapy Discharge Summary  Patient Details  Name: Kristi Wiley MRN: [id number] Date of Birth: 06/09/1940 Referring Provider (PT): Ninfa Linden   Progress Note Reporting Period 05/12/20 to 06/09/20  Crafts note below for Objective Data and Assessment of Progress/Goals.     Encounter Date: 06/09/2020   PT End of Session - 06/09/20 1602    Visit Number 7    Number of Visits 17    Date for PT Re-Evaluation 07/07/20    Authorization Type Medicare & BCBS    PT Start Time 1530    PT Stop Time 1601    PT Time Calculation (min) 31 min    Activity Tolerance Patient tolerated treatment well    Behavior During Therapy WFL for tasks assessed/performed           Past Medical History:  Diagnosis Date  . Arthritis    oa of right hip , left hip , left thumb   . Breast cancer ([place])   . 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  . Elevated cholesterol   . History of right hip replacement 05/17/2018  . Hypertension   . Mitral valve disorder    sicne birth   . Pain of right hip joint 06/19/2017  . 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 Tinnitus   . Unilateral primary osteoarthritis, 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  . Unilateral primary osteoarthritis, 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    Past Surgical History:  Procedure Laterality Date  . ABDOMINAL HYSTERECTOMY  1986  . BREAST SURGERY Right 2000   reconstructive    . CATARACT EXTRACTION, BILATERAL  2015  . eye cosmetic  Bilateral    [place]   . 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 TONSILLECTOMY    . TOTAL HIP ARTHROPLASTY Right 10/06/2017   Procedure: RIGHT TOTAL HIP ARTHROPLASTY ANTERIOR APPROACH;  Surgeon: Mcarthur Rossetti, MD;  [facility];  Service: Orthopedics;  Laterality: Right;  . TOTAL HIP ARTHROPLASTY Left 09/06/2019    Procedure: LEFT TOTAL HIP ARTHROPLASTY ANTERIOR APPROACH;  Surgeon: Mcarthur Rossetti, MD;  [facility];  Service: Orthopedics;  Laterality: Left;  . TOTAL MASTECTOMY Right 01/24/1996    There were no vitals filed for this visit.   Subjective Assessment - 06/09/20 1531    Subjective Feeloing okay. Feeling ready to wrap up with PT today- feels that she can work on her back at home with her HEP.    Pertinent History Right THR 2019, left THR July 2021. hysterectomy. Mastectomy 80 yo due to Breast CA    Patient Stated Goals to decrease pain, to get back to walking with decreased pain    Currently in Pain? No/denies              [facility] PT Assessment - 06/09/20 0001      Assessment   Medical Diagnosis chronic low back pain with sciatica    Referring Provider (PT) Ninfa Linden      Observation/Other Assessments   Focus on Therapeutic Outcomes (FOTO)  Lumbar: 61      AROM   Lumbar - Right Rotation WFL   no pain   Lumbar - Left Rotation WFL   no pain     Strength   Right Hip Flexion 4+/5    Right Hip External Rotation  4/5  Right Hip Internal Rotation 4+/5    Right Hip ABduction 4/5    Right Hip ADduction 4+/5    Left Hip Flexion 4+/5    Left Hip External Rotation 4+/5    Left Hip Internal Rotation 4+/5    Left Hip ABduction 4+/5    Left Hip ADduction 4+/5      Standardized Balance Assessment   Standardized Balance Assessment Five Times Sit to Stand    Five times sit to stand comments  9.91 sec   slight L wt shift                        [facility] Adult PT Treatment/Exercise - 06/09/20 0001      Lumbar Exercises: Aerobic   Nustep L4x 82min (UEs/LEs)      Knee/Hip Exercises: Sidelying   Clams 10x each LE without TB, 10x each LE with yellow TB   cues to avoid rotation back                 PT Education - 06/09/20 1602    Education Details update to HEP; discussion on objective progress and remaining impairments    Person(s) Educated Patient     Methods Explanation;Demonstration;Tactile cues;Verbal cues;Handout    Comprehension Returned demonstration;Verbalized understanding            PT Short Term Goals - 06/09/20 1548      PT SHORT TERM GOAL #1   Title Pt will be independent with initial HEP    Time 2    Period Weeks    Status Achieved    Target Date 05/26/20             PT Long Term Goals - 06/09/20 1548      PT LONG TERM GOAL #1   Title Pt will demo improved BLE strength to >/= 4+/5    Time 8    Status Partially Met   R hip ER and ABD weakness remaining     PT LONG TERM GOAL #2   Title Pt will demo improved control, safety and symmetry with 5 TSTS test    Time 8    Period Weeks    Status Achieved   very slight L wt shift remaining     PT LONG TERM GOAL #3   Title Pt will demonstrate improved thoracolumbar rotation ROM with </= 2/10 pain to facilitate safe and comfortable trunk rotation for ADLs and driving    Time 8    Period Weeks    Status Achieved      PT LONG TERM GOAL #4   Title Pt will report </= 2/10 low back and leg pain with prolonged walking in community    Time 8    Period Weeks    Status Achieved   reporting no pain, but noting fatigue in her back. reports being able to tolerate 2 miles at this time                Plan - 06/09/20 1603    Clinical Impression Statement Patient arrived to session with report that she feels ready to wrap up with PT at this time with transition to HEP. Notes that she is able to complete a 2 mile walk at this time with c/o fatigue in her LB rather than pain. Strength testing revealed improvement in B hip flexion, ER, and abduction, with slight remaining weakness in L hip. B lumbar rotation is now [facility] and nonpainful. Patient was  able to perform 5xSTS within <12 seconds, indicating decreased risk of falls. Patient still demonstrates slight L weight shift with transfers. Updated HEP with LE strengthening to address remaining deficits seen on objective measures  today. Encouraged patient to continue walking program for continued fitness. Patient reported all understanding of HEP provided today and without complaints at end of session. Patient has met or partially met at this time and is ready for DC with transition to HEP.    Personal Factors and Comorbidities Age;Past/Current Experience;Time since onset of injury/illness/exacerbation;Fitness;Comorbidity 3+    Comorbidities HTN, Arthitis, Hx breast CA    PT Frequency 2x / week    PT Duration 8 weeks    PT Treatment/Interventions ADLs/Self Care Home Management;Electrical Stimulation;Iontophoresis 4mg /ml Dexamethasone;Moist Heat;Traction;Cryotherapy;Gait training;Neuromuscular re-education;Balance training;Therapeutic exercise;Therapeutic activities;Functional mobility training;Patient/family education;Manual techniques;Energy conservation;Taping    PT Next Visit Plan DC at this time    PT Home Exercise Plan Initial HEP: [email or web address]    Consulted and Agree with Plan of Care Patient           Patient will benefit from skilled therapeutic intervention in order to improve the following deficits and impairments:  Abnormal gait,Decreased range of motion,Decreased endurance,Pain,Improper body mechanics,Impaired flexibility,Hypomobility,Decreased balance,Decreased mobility,Decreased strength,Impaired sensation,Postural dysfunction  Visit Diagnosis: Abnormal posture  Muscle weakness (generalized)  Low back pain, unspecified back pain laterality, unspecified chronicity, unspecified whether sciatica present  Unspecified disturbances of skin sensation  Pain in right hip  Stiffness of right hip, not elsewhere classified  Stiffness of left hip, not elsewhere classified  Pain in left hip     Problem List Patient Active Problem List   Diagnosis Date Noted  . Status post total replacement of left hip 09/06/2019  . History of right hip replacement 05/17/2018  . Status post total replacement of right hip  10/06/2017  . Pain of right hip joint 06/19/2017  . Unilateral primary osteoarthritis, 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  . Unilateral primary osteoarthritis, 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  . 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     PHYSICAL THERAPY DISCHARGE SUMMARY  Visits from Start of Care: 7  Current functional level related to goals / functional outcomes: [place] above clinical impression   Remaining deficits: R hip weakness   Education / Equipment: HEP  Plan: Patient agrees to discharge.  Patient goals were partially met. Patient is being discharged due to being pleased with the current functional level.  ?????     Janene Harvey, PT, DPT 06/09/20 4:08 PM   [facility] [address]  [place], [place], [address] Phone: [phone]   Fax:  [phone]  Name: Kristi Wiley MRN: [id number] Date of Birth: 07/09/1940

## 2020-06-15 ENCOUNTER — Ambulatory Visit (INDEPENDENT_AMBULATORY_CARE_PROVIDER_SITE_OTHER): Payer: Medicare Other | Admitting: Orthopaedic Surgery

## 2020-06-15 ENCOUNTER — Encounter: Payer: Self-pay | Admitting: Orthopaedic Surgery

## 2020-06-15 DIAGNOSIS — G8929 Other chronic pain: Secondary | ICD-10-CM | POA: Diagnosis not present

## 2020-06-15 DIAGNOSIS — M5442 Lumbago with sciatica, left side: Secondary | ICD-10-CM

## 2020-06-15 NOTE — Progress Notes (Signed)
The patient is here today for follow-up after having physical therapy on the lumbar spine.  She has been having a week back when she does a lot of things at home standing up.  She is a very young appearing 80 year old female.  I have replaced both of her hips with the most recent left hip replaced in June of last year.  She says therapy has helped her quite a bit and she is doing better overall.  She does get some occasional numbness from her knee down on the right side but no glaring deformities.  She is walking without assistive device and no limp.  Her left hip moves smoothly and fluidly as is her right hip.  Her right knee exam is also normal today.  She has better core strength in flexion-extension of her lumbar spine is good.  At this point follow-up can be as needed since she is doing so well.  If she does have any issues with her hips or anything else she knows to come and [place] and let us know.  All questions and concerns were answered and addressed.

## 2020-06-17 NOTE — Progress Notes (Signed)
HPI: FU mitral valve prolapse. Previously followed by Dr. Donnetta Hutching. Last echocardiogram April 2021 showed normal LV function, grade 1 diastolic dysfunction, moderate left atrial enlargement, mild right atrial enlargement, mild mitral regurgitation.  Calcium score April 2021 20.  Since last seen the patient has dyspnea with more extreme activities but not with routine activities. It is relieved with rest. It is not associated with chest pain. There is no orthopnea, PND or pedal edema. There is no syncope or palpitations. There is no exertional chest pain.   Current Outpatient Medications  Medication Sig Dispense Refill  . aspirin 81 MG chewable tablet Chew 1 tablet (81 mg total) by mouth 2 (two) times daily. 30 tablet 0  . Biotin 10000 MCG TABS Take 10,000 mcg by mouth daily after supper.     . cholecalciferol (VITAMIN D) 1000 units tablet Take 1,000 Units by mouth daily.    Marland Kitchen FIBER PO Take 4.2 g by mouth daily. Metamucil    . hydroxypropyl methylcellulose / hypromellose (ISOPTO TEARS / GONIOVISC) 2.5 % ophthalmic solution Place 1 drop into both eyes 3 (three) times daily as needed for dry eyes.    Marland Kitchen labetalol (NORMODYNE) 200 MG tablet Take 200 mg by mouth 2 (two) times daily.    Marland Kitchen losartan-hydrochlorothiazide (HYZAAR) 100-12.5 MG tablet Take 1 tablet by mouth daily at 12 noon. Mid day    . methylPREDNISolone (MEDROL) 4 MG tablet Medrol dose pack. Take as instructed 21 tablet 0  . Multiple Vitamin (MULTIVITAMIN WITH MINERALS) TABS tablet Take 1 tablet by mouth daily. Centrum Silver for Women    . Omega-3 Fatty Acids (FISH OIL) 1000 MG CAPS Take 1,000 mg by mouth daily with supper.     Marland Kitchen OVER THE COUNTER MEDICATION Take 1 tablet by mouth 2 (two) times daily. [place]    . pravastatin (PRAVACHOL) 40 MG tablet Take 40 mg by mouth at bedtime.      No current facility-administered medications for this visit.     Past Medical History:  Diagnosis Date  . Arthritis    oa  of right hip , left hip , left thumb   . Breast cancer ([place])   . 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  . Elevated cholesterol   . History of right hip replacement 05/17/2018  . Hypertension   . Mitral valve disorder    sicne birth   . Pain of right hip joint 06/19/2017  . 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 Tinnitus   . Unilateral primary osteoarthritis, 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  . Unilateral primary osteoarthritis, 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    Past Surgical History:  Procedure Laterality Date  . ABDOMINAL HYSTERECTOMY  1986  . BREAST SURGERY Right 2000   reconstructive    . CATARACT EXTRACTION, BILATERAL  2015  . eye cosmetic  Bilateral    [place]   . 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 TONSILLECTOMY    . TOTAL HIP ARTHROPLASTY Right 10/06/2017   Procedure: RIGHT TOTAL HIP ARTHROPLASTY ANTERIOR APPROACH;  Surgeon: Mcarthur Rossetti, MD;  Location: [facility];  Service: Orthopedics;  Laterality: Right;  . TOTAL HIP ARTHROPLASTY Left 09/06/2019   Procedure: LEFT TOTAL HIP ARTHROPLASTY ANTERIOR APPROACH;  Surgeon: Mcarthur Rossetti, MD;  Location: [facility];  Service: Orthopedics;  Laterality: Left;  . TOTAL MASTECTOMY Right 01/24/1996    Social History   Socioeconomic History  . Marital status:  Widowed    Spouse name: Not on file  . Number of children: Not on file  . Years of education: Not on file  . Highest education level: Not on file  Occupational History  . Not on file  Tobacco Use  . Smoking status: Never Smoker  . Smokeless tobacco: Never Used  Vaping Use  . Vaping Use: Never used  Substance and Sexual Activity  . Alcohol use: Yes    Comment: rare , wine    . Drug use: Never  . Sexual activity: Not on file  Other Topics Concern  . Not on file  Social History Narrative  . Not on file   Social Determinants of Health   Financial Resource Strain: Not on file  Food Insecurity: Not on file   Transportation Needs: Not on file  Physical Activity: Not on file  Stress: Not on file  Social Connections: Not on file  Intimate Partner Violence: Not on file    Family History  Problem Relation Age of Onset  . CAD Father     ROS: no fevers or chills, productive cough, hemoptysis, dysphasia, odynophagia, melena, hematochezia, dysuria, hematuria, rash, seizure activity, orthopnea, PND, pedal edema, claudication. Remaining systems are negative.  Physical Exam: Well-developed well-nourished in no acute distress.  Skin is warm and dry.  HEENT is normal.  Neck is supple.  Chest is clear to auscultation with normal expansion.  Cardiovascular exam is regular rate and rhythm.  Abdominal exam nontender or distended. No masses palpated. Extremities show no edema. neuro grossly intact  ECG-sinus bradycardia with first-degree AV block, no ST changes.  Personally reviewed  A/P  1 history of mitral valve prolapse-most recent echocardiogram showed mild mitral regurgitation.  Plan follow-up studies in the future.  2 hypertension-blood pressure controlled.  Continue present medications and follow.  3 hyperlipidemia-continue statin.  4 mildly elevated calcium score-continue statin.  Kirk Ruths, MD

## 2020-06-24 ENCOUNTER — Ambulatory Visit (INDEPENDENT_AMBULATORY_CARE_PROVIDER_SITE_OTHER): Payer: Medicare Other | Admitting: Cardiology

## 2020-06-24 ENCOUNTER — Other Ambulatory Visit: Payer: Self-pay

## 2020-06-24 ENCOUNTER — Encounter: Payer: Self-pay | Admitting: Cardiology

## 2020-06-24 VITALS — BP 128/50 | HR 57 | Ht 66.0 in | Wt 159.0 lb

## 2020-06-24 DIAGNOSIS — E7800 Pure hypercholesterolemia, unspecified: Secondary | ICD-10-CM | POA: Diagnosis not present

## 2020-06-24 DIAGNOSIS — I059 Rheumatic mitral valve disease, unspecified: Secondary | ICD-10-CM

## 2020-06-24 DIAGNOSIS — I10 Essential (primary) hypertension: Secondary | ICD-10-CM

## 2020-06-24 NOTE — Patient Instructions (Signed)

## 2021-04-24 IMAGING — CT CT CARDIAC CORONARY ARTERY CALCIUM SCORE
3 series · 14 of 20 positions shown, 15 images · non-contrast
Comparison: None.
COMPARISON: None.

Addendum:
EXAM:
OVER-READ INTERPRETATION  CT CHEST

The following report is an over-read performed by radiologist Dr.
Yiwei Libera [REDACTED] on 07/04/2019. This over-read
does not include interpretation of cardiac or coronary anatomy or
pathology. The coronary calcium score interpretation by the
cardiologist is attached.
CLINICAL DATA: Risk stratification. History of mitral valve
prolapse.
Coronary Calcium Score
TECHNIQUE: The patient was scanned on a Siemens Force scanner. Axial
non-contrast 3 mm slices were carried out through the heart. The
data set was analyzed on a dedicated work station and scored using
the Agatson method.

[Series 2: casc 3.0 bv41 2 bestdiast 70 % · axial · 0.34mm/px · z∈[-226,-158]mm · 4 of 39 slices shown, 5 images]
[im 8/39  vessel]
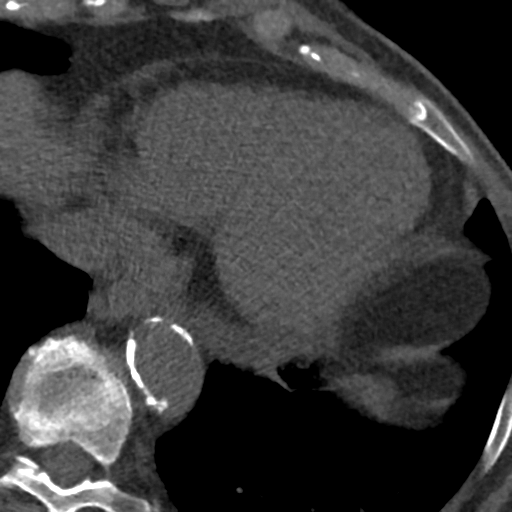
[im 8/39  lung]
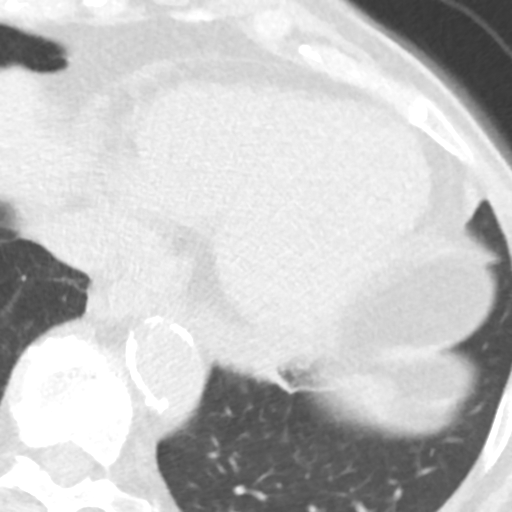
[im 16/39  vessel]
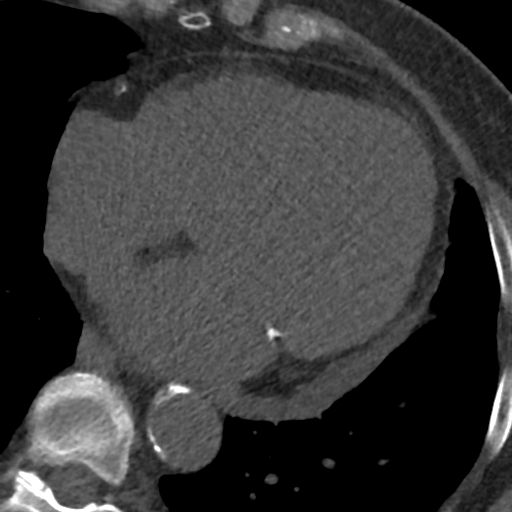
[im 23/39  vessel]
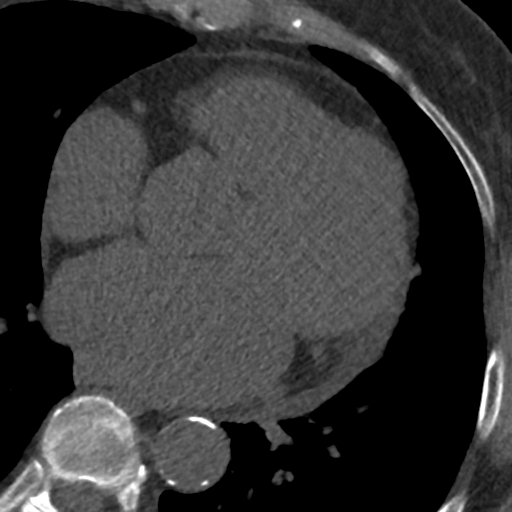
[im 31/39  vessel]
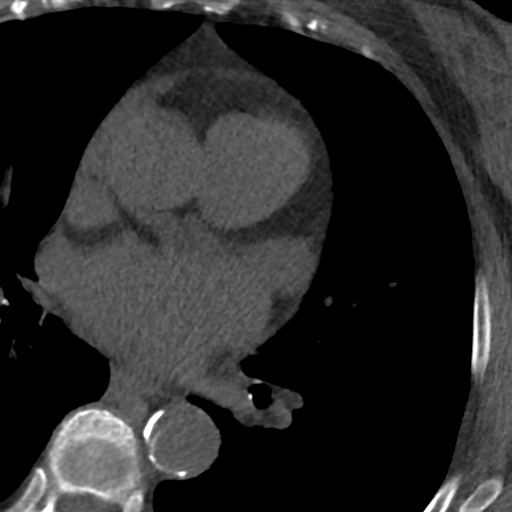

[Series 3: lung 71 % · axial · 0.68mm/px · z∈[-230,-154]mm · 5 of 39 slices shown]
[im 7/39  lung]
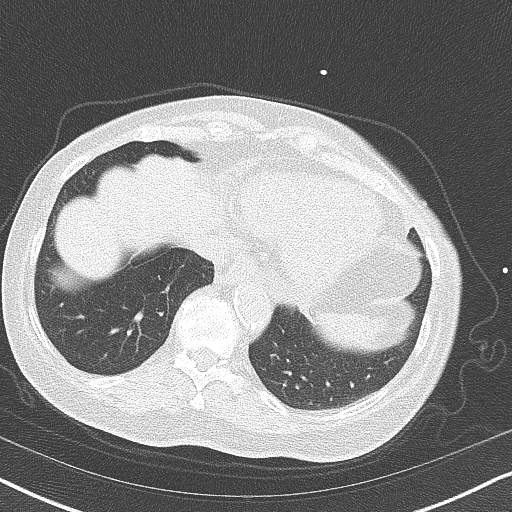
[im 13/39  lung]
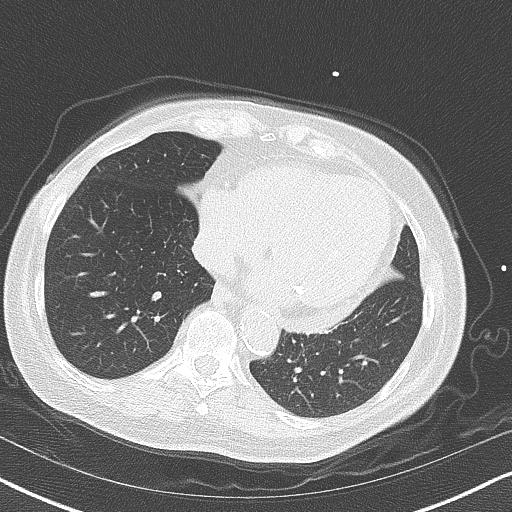
[im 20/39  lung]
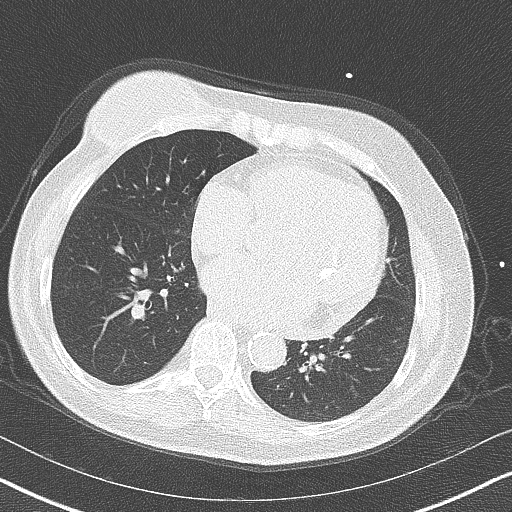
[im 26/39  lung]
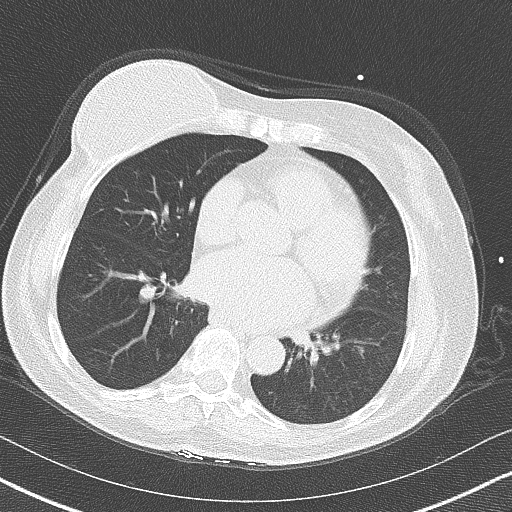
[im 32/39  lung]
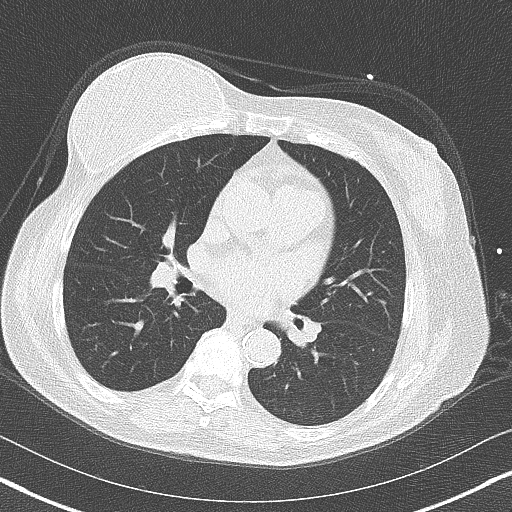

[Series 4: lung st 71 % · axial · 0.68mm/px · z∈[-230,-154]mm · 5 of 39 slices shown]
[im 7/39  lung]
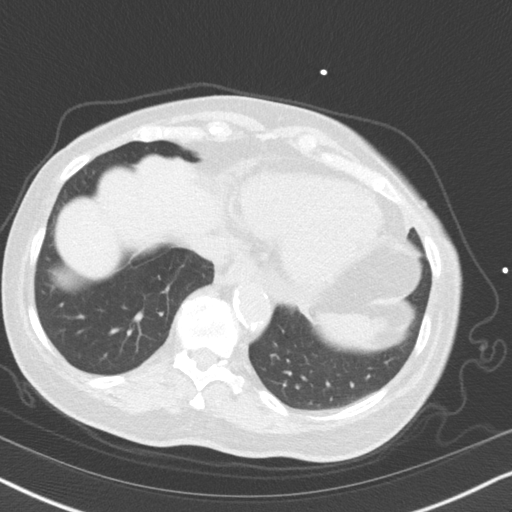
[im 13/39  lung]
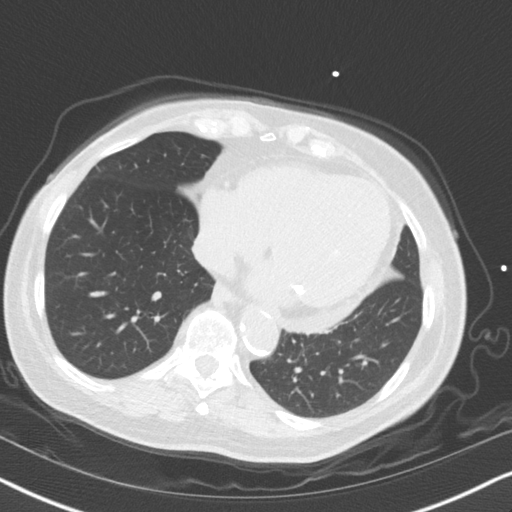
[im 20/39  lung]
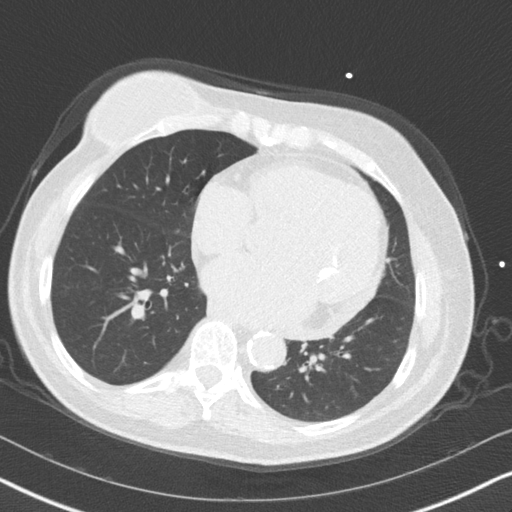
[im 26/39  lung]
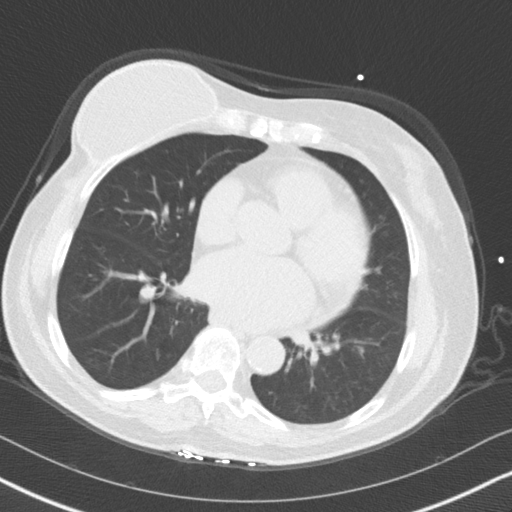
[im 32/39  lung]
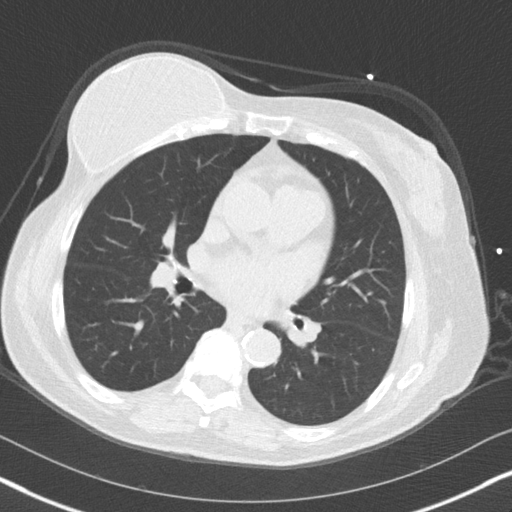

[14 of 20 positions shown; findings below may reference images not displayed]

FINDINGS: Vascular: Heart is normal size. Small pericardial effusion. Aortic
atherosclerosis in the visualized descending thoracic aorta. No
aneurysm.

Mediastinum/Nodes: No adenopathy in the lower mediastinum or hila.

Lungs/Pleura: No confluent opacities or effusions.

Upper Abdomen: Imaging into the upper abdomen shows no acute
findings.

Musculoskeletal: Prior right breast augmentation. No acute bony
abnormality.
IMPRESSION: Small pericardial effusion.  Aortic atherosclerosis.

No acute findings.
FINDINGS: Non-cardiac: See separate report from [REDACTED].

Ascending Aorta: 32 mm at mid ascending aorta measured in axial
plane.

Pericardium: Small posterior pericardial effusion.

Moderate mitral annular calcification, severe subvalvular apparatus
calcifications. Comparison to echocardiogram 07/04/19 performed.

Trivial aortic valve calcification.

Coronary arteries:

Coronary calcium score of 20. This was 28 percentile for age and sex
matched control.
IMPRESSION: 1. Coronary calcium score of 20. This was 28 percentile for age and
sex matched control.

2. Moderate mitral annular calcification. Calcifications in left
ventricle suggestive of severe mitral subvalvular apparatus
calcification.

3.  Small, primarily posterior pericardial effusion.

*** End of Addendum ***
EXAM:
OVER-READ INTERPRETATION  CT CHEST

The following report is an over-read performed by radiologist Dr.
Yiwei Libera [REDACTED] on 07/04/2019. This over-read
does not include interpretation of cardiac or coronary anatomy or
pathology. The coronary calcium score interpretation by the
cardiologist is attached.
FINDINGS: Vascular: Heart is normal size. Small pericardial effusion. Aortic
atherosclerosis in the visualized descending thoracic aorta. No
aneurysm.

Mediastinum/Nodes: No adenopathy in the lower mediastinum or hila.

Lungs/Pleura: No confluent opacities or effusions.

Upper Abdomen: Imaging into the upper abdomen shows no acute
findings.

Musculoskeletal: Prior right breast augmentation. No acute bony
abnormality.
IMPRESSION: Small pericardial effusion.  Aortic atherosclerosis.

No acute findings.

## 2021-06-27 IMAGING — DX DG PORTABLE PELVIS
1 series · 1 of 1 positions shown · non-contrast
Comparison: 03/16/2017

CLINICAL DATA: Status post left hip replacement.

EXAM:
PORTABLE PELVIS 1-2 VIEWS

[pelvis ap]
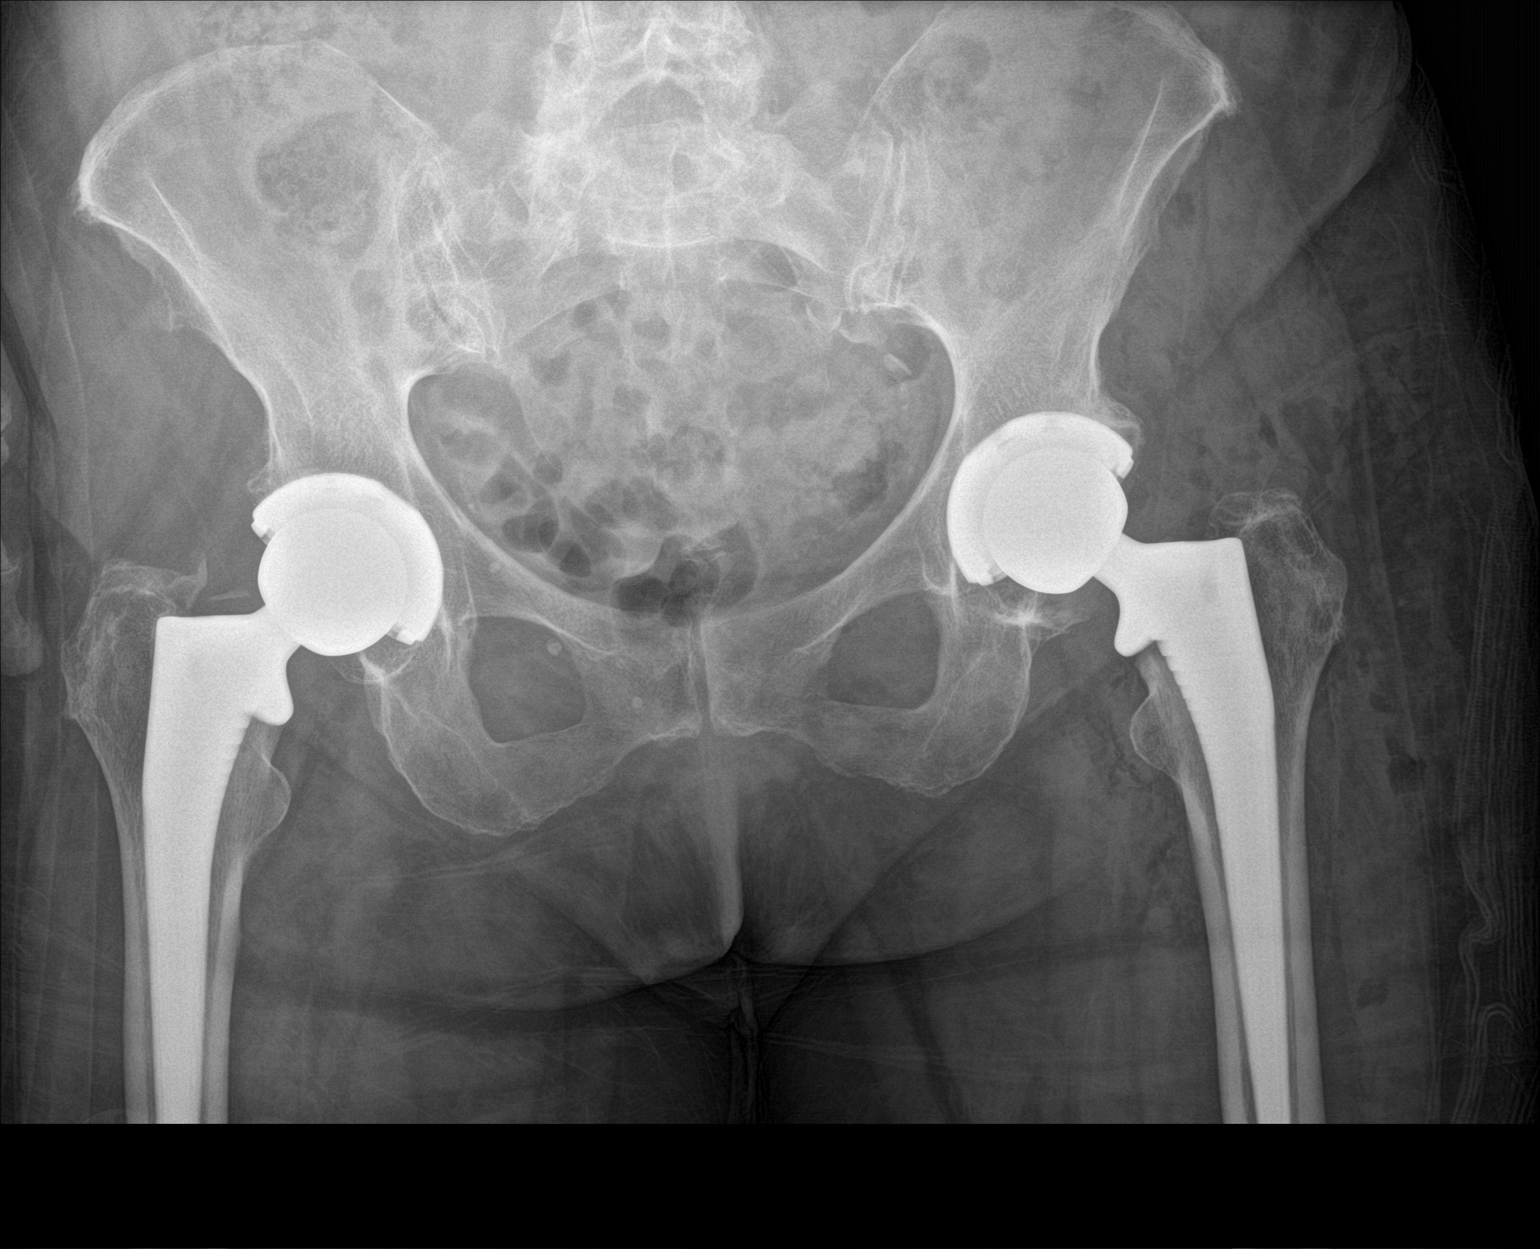

[1 of 1 positions shown; findings below may reference images not displayed]

FINDINGS: Interval left total hip replacement in satisfactory position and
alignment. There is also an interval right total hip replacement in
satisfactory position and alignment. No fracture or dislocation
seen.
IMPRESSION: Satisfactory postoperative appearance of a left total hip
replacement.

## 2021-06-27 IMAGING — RF DG HIP (WITH PELVIS) OPERATIVE*L*
1 series · 2 of 2 positions shown · non-contrast
Comparison: 07/10/2019

CLINICAL DATA: Left hip replacement.

EXAM:
OPERATIVE LEFT HIP (WITH PELVIS IF PERFORMED) 2 VIEWS
TECHNIQUE: Fluoroscopic spot image(s) were submitted for interpretation
post-operatively.

[Series 1: unknown protocol · 0.20mm/px · 2 of 2 slices shown]
[im 1/2]
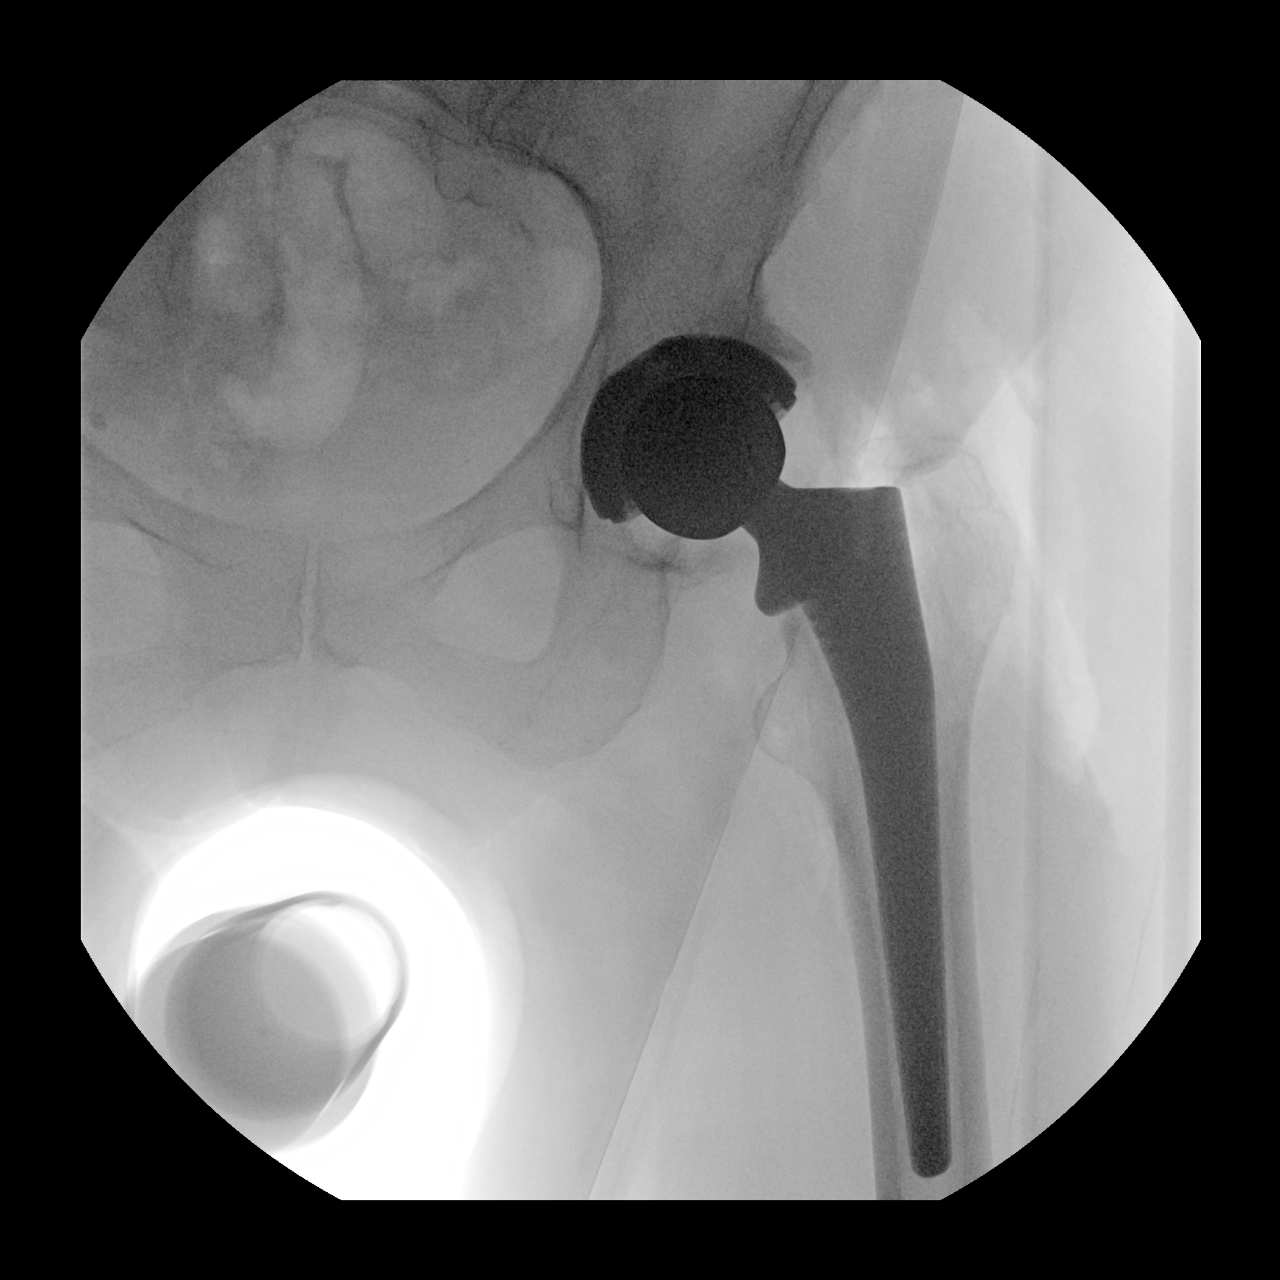
[im 2/2]
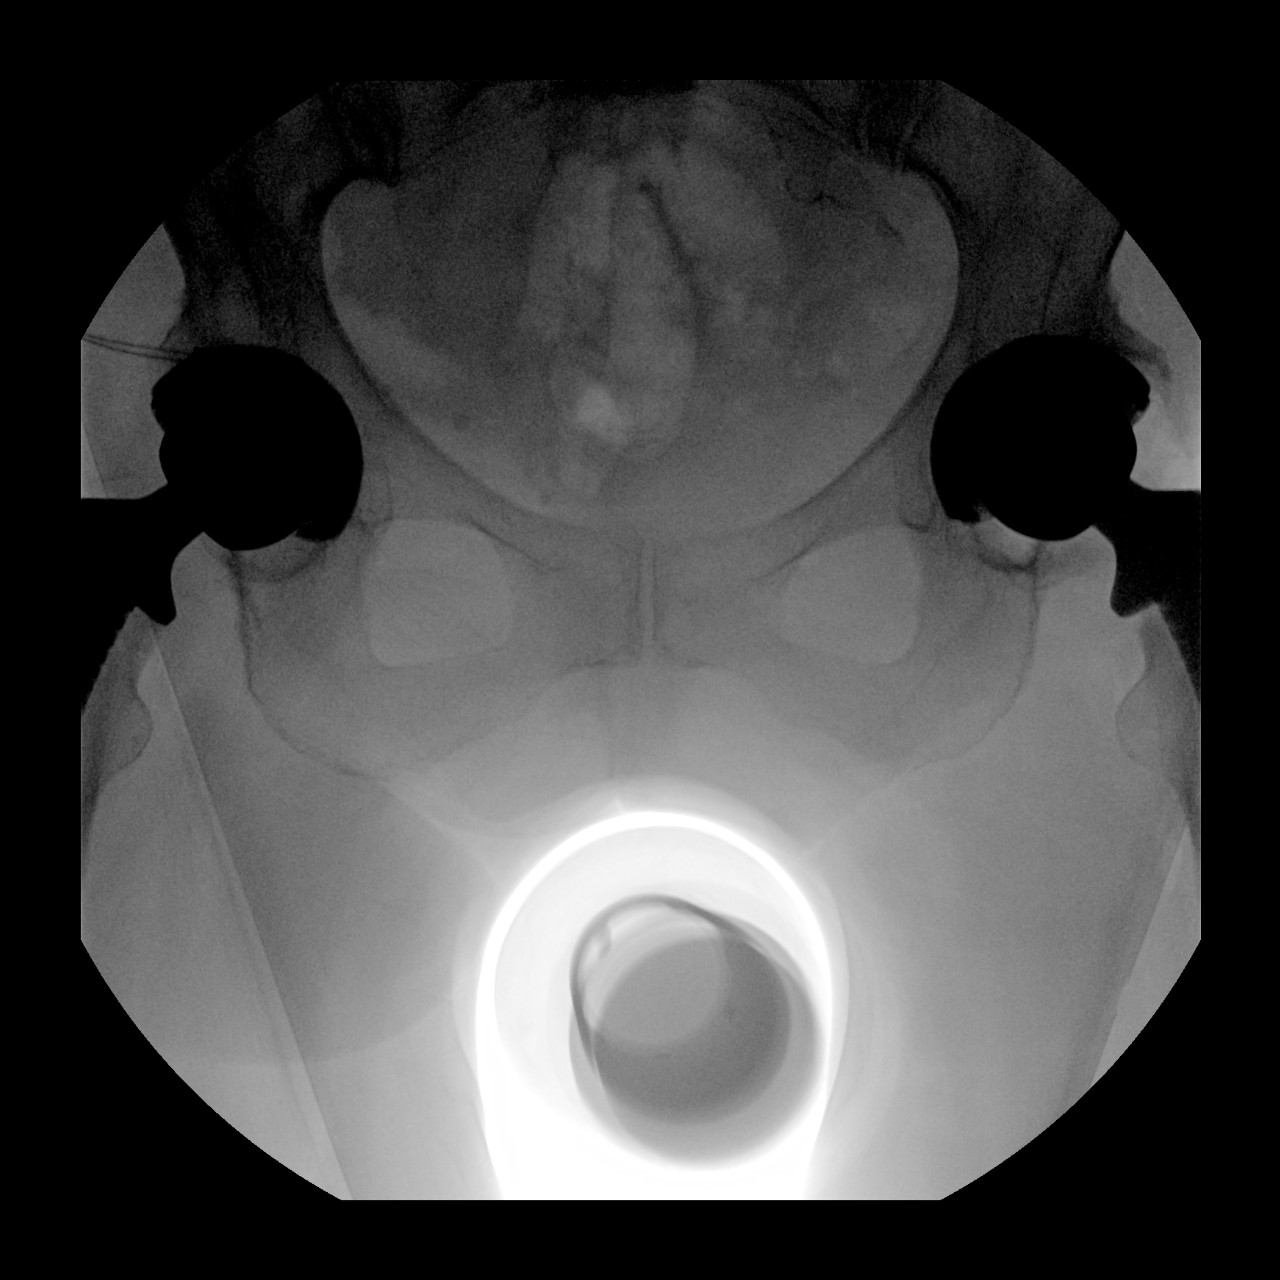

[2 of 2 positions shown; findings below may reference images not displayed]

FINDINGS: Interval left total hip replacement in satisfactory position and
alignment. No fracture or dislocation seen.
IMPRESSION: Satisfactory postoperative appearance of a left total hip
replacement.

## 2021-07-22 NOTE — Progress Notes (Signed)
? ? ? ? ?HPI:FU mitral valve prolapse. Previously followed by Dr. Donnetta Hutching. Last echocardiogram April 2021 showed normal LV function, grade 1 diastolic dysfunction, moderate left atrial enlargement, mild right atrial enlargement, mild mitral regurgitation.  Calcium score April 2021 20.  Since last seen she has some dyspnea on exertion that she attributes to deconditioning.  No orthopnea, PND, pedal edema, exertional chest pain or syncope. ? ?Current Outpatient Medications  ?Medication Sig Dispense Refill  ? aspirin EC 81 MG tablet Take 81 mg by mouth daily. Swallow whole.    ? Biotin 10000 MCG TABS Take 10,000 mcg by mouth daily after supper.     ? cholecalciferol (VITAMIN D) 1000 units tablet Take 2,000 Units by mouth daily.    ? FIBER PO Take 4.2 g by mouth daily. Metamucil    ? hydroxypropyl methylcellulose / hypromellose (ISOPTO TEARS / GONIOVISC) 2.5 % ophthalmic solution Place 1 drop into both eyes 3 (three) times daily as needed for dry eyes.    ? labetalol (NORMODYNE) 200 MG tablet Take 200 mg by mouth 2 (two) times daily.    ? losartan-hydrochlorothiazide (HYZAAR) 100-12.5 MG tablet Take 1 tablet by mouth daily at 12 noon. Mid day    ? Multiple Vitamin (MULTIVITAMIN WITH MINERALS) TABS tablet Take 1 tablet by mouth daily. Centrum Silver for Women    ? Omega-3 Fatty Acids (FISH OIL) 1000 MG CAPS Take 1,000 mg by mouth daily with supper.     ? OVER THE COUNTER MEDICATION Take 1 tablet by mouth 2 (two) times daily. Viviscal [facility] [employer]    ? pravastatin (PRAVACHOL) 40 MG tablet Take 40 mg by mouth at bedtime.     ? psyllium (METAMUCIL) 58.6 % packet Take by mouth daily.    ? ?No current facility-administered medications for this visit.  ? ? ? ?Past Medical History:  ?Diagnosis Date  ? Arthritis   ? oa of right hip , left hip , left thumb   ? Breast cancer ([place])   ? 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 Elevated cholesterol   ? 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  Hypertension   ? Mitral valve disorder   ? sicne birth   ? Pain of right hip joint 06/19/2017  ? 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 Tinnitus   ? 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 Unilateral primary osteoarthritis, 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  ? ? ?Past Surgical History:  ?Procedure Laterality Date  ? ABDOMINAL HYSTERECTOMY  1986  ? BREAST SURGERY Right 2000  ? reconstructive    ? CATARACT EXTRACTION, BILATERAL  2015  ? eye cosmetic  Bilateral   ? [place]   ? MOHS SURGERY  03/26/2010  ? nose skin cancer   ? TONSILLECTOMY    ? TOTAL HIP ARTHROPLASTY Right 10/06/2017  ? Procedure: RIGHT TOTAL HIP ARTHROPLASTY ANTERIOR APPROACH;  Surgeon: Mcarthur Rossetti, MD;  Location: [facility];  Service: Orthopedics;  Laterality: Right;  ? TOTAL HIP ARTHROPLASTY Left 09/06/2019  ? Procedure: LEFT TOTAL HIP ARTHROPLASTY ANTERIOR APPROACH;  Surgeon: Mcarthur Rossetti, MD;  Location: [facility];  Service: Orthopedics;  Laterality: Left;  ? TOTAL MASTECTOMY Right 01/24/1996  ? ? ?Social History  ? ?Socioeconomic History  ? Marital status: Widowed  ?  Spouse name: Not on file  ? Number of children: Not on file  ? Years of education: Not on file  ? Highest education level: Not on file  ?  Occupational History  ? Not on file  ?Tobacco Use  ? Smoking status: Never  ? Smokeless tobacco: Never  ?Vaping Use  ? Vaping Use: Never used  ?Substance and Sexual Activity  ? Alcohol use: Yes  ?  Comment: rare , wine    ? Drug use: Never  ? Sexual activity: Not on file  ?Other Topics Concern  ? Not on file  ?Social History Narrative  ? Not on file  ? ?Social Determinants of Health  ? ?Financial Resource Strain: Not on file  ?Food Insecurity: Not on file  ?Transportation Needs: Not on file  ?Physical Activity: Not on file  ?Stress: Not on file  ?Social Connections: Not on file  ?Intimate Partner Violence: Not on file  ? ? ?Family History  ?Problem Relation Age of Onset  ? CAD Father   ? ? ?ROS: no fevers or chills,  productive cough, hemoptysis, dysphasia, odynophagia, melena, hematochezia, dysuria, hematuria, rash, seizure activity, orthopnea, PND, pedal edema, claudication. Remaining systems are negative. ? ?Physical Exam: ?Well-developed well-nourished in no acute distress.  ?Skin is warm and dry.  ?HEENT is normal.  ?Neck is supple.  ?Chest is clear to auscultation with normal expansion.  ?Cardiovascular exam is regular rate and rhythm.  ?Abdominal exam nontender or distended. No masses palpated. ?Extremities show no edema. ?neuro grossly intact ? ?ECG-sinus bradycardia with first-degree AV block, no ST changes.  Personally reviewed ? ?A/P ? ?1 mitral valve prolapse-last echocardiogram showed mild mitral regurgitation. ? ?2 mildly elevated calcium score-patient denies chest pain.  Continue statin. ? ?3 hypertension-patient's blood pressure is elevated; she states however that her systolic is typically in the 914N and diastolic 50.  Continue present medications and follow. ? ?4 hyperlipidemia-Continue statin. ? ?Kirk Ruths, MD ? ? ? ?

## 2021-08-04 ENCOUNTER — Encounter: Payer: Self-pay | Admitting: Cardiology

## 2021-08-04 ENCOUNTER — Ambulatory Visit (INDEPENDENT_AMBULATORY_CARE_PROVIDER_SITE_OTHER): Payer: Medicare Other | Admitting: Cardiology

## 2021-08-04 VITALS — BP 160/64 | HR 53 | Ht 66.0 in | Wt 163.0 lb

## 2021-08-04 DIAGNOSIS — E7800 Pure hypercholesterolemia, unspecified: Secondary | ICD-10-CM | POA: Diagnosis not present

## 2021-08-04 DIAGNOSIS — I10 Essential (primary) hypertension: Secondary | ICD-10-CM | POA: Diagnosis not present

## 2021-08-04 DIAGNOSIS — I059 Rheumatic mitral valve disease, unspecified: Secondary | ICD-10-CM | POA: Diagnosis not present

## 2021-08-04 NOTE — Patient Instructions (Signed)

## 2022-08-12 NOTE — Progress Notes (Signed)
HPI: FU mitral valve prolapse. Previously followed by Dr. Bary Castilla. Last echocardiogram April 2021 showed normal LV function, grade 1 diastolic dysfunction, moderate left atrial enlargement, mild right atrial enlargement, mild mitral regurgitation.  Calcium score April 2021 20.  Since last seen she has occasional dyspnea with more vigorous activities.  No orthopnea, PND, pedal edema, exertional chest pain or syncope.  Current Outpatient Medications  Medication Sig Dispense Refill   amLODipine (NORVASC) 2.5 MG tablet Take 2.5 mg by mouth daily.     Apoaequorin (PREVAGEN) 10 MG CAPS      aspirin EC 81 MG tablet Take 81 mg by mouth daily. Swallow whole.     Biotin 16109 MCG TABS Take 10,000 mcg by mouth daily after supper.      cholecalciferol (VITAMIN D) 1000 units tablet Take 2,000 Units by mouth daily.     FIBER PO Take 4.2 g by mouth daily. Metamucil     hydroxypropyl methylcellulose / hypromellose (ISOPTO TEARS / GONIOVISC) 2.5 % ophthalmic solution Place 1 drop into both eyes 3 (three) times daily as needed for dry eyes.     labetalol (NORMODYNE) 200 MG tablet Take 200 mg by mouth 2 (two) times daily.     losartan-hydrochlorothiazide (HYZAAR) 100-12.5 MG tablet Take 1 tablet by mouth daily at 12 noon. Mid day     Multiple Vitamin (MULTIVITAMIN WITH MINERALS) TABS tablet Take 1 tablet by mouth daily. Centrum Silver for Women     Omega-3 Fatty Acids (FISH OIL) 1000 MG CAPS Take 1,000 mg by mouth daily with supper.      OVER THE COUNTER MEDICATION Take 1 tablet by mouth 2 (two) times daily. Viviscal [employer]     pravastatin (PRAVACHOL) 40 MG tablet Take 40 mg by mouth at bedtime.      psyllium (METAMUCIL) 58.6 % packet Take by mouth daily.     No current facility-administered medications for this visit.     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   Unilateral primary osteoarthritis, 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    Past Surgical History:  Procedure Laterality Date   ABDOMINAL HYSTERECTOMY  1986   BREAST SURGERY Right 2000   reconstructive     CATARACT EXTRACTION, BILATERAL  2015   eye cosmetic  Bilateral    [place]    MOHS SURGERY  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    TONSILLECTOMY     TOTAL HIP ARTHROPLASTY Right 10/06/2017   Procedure: RIGHT TOTAL HIP ARTHROPLASTY ANTERIOR APPROACH;  Surgeon: Kathryne Hitch, MD;  Location: [facility];  Service: Orthopedics;  Laterality: Right;   TOTAL HIP ARTHROPLASTY Left 09/06/2019   Procedure: LEFT TOTAL HIP ARTHROPLASTY ANTERIOR APPROACH;  Surgeon: Kathryne Hitch, MD;  Location: [facility];  Service: Orthopedics;  Laterality: Left;   TOTAL MASTECTOMY Right 01/24/1996    Social History   Socioeconomic History   Marital status: Widowed    Spouse name: Not on file  Number of children: Not on file   Years of education: Not on file   Highest education level: Not on file  Occupational History   Not on file  Tobacco Use   Smoking status: Never   Smokeless tobacco: Never  Vaping Use   Vaping Use: Never used  Substance and Sexual Activity   Alcohol use: Yes    Comment: rare , wine     Drug use: Never   Sexual activity: Not on file  Other Topics Concern   Not on file  Social History Narrative   Not on file   Social Determinants of Health   Financial Resource Strain: Not on file  Food Insecurity: Not on file  Transportation Needs: Not on file  Physical Activity: Not on file  Stress: Not on file  Social Connections: Not on file  Intimate Partner Violence: Not on file     Family History  Problem Relation Age of Onset   CAD Father     ROS: no fevers or chills, productive cough, hemoptysis, dysphasia, odynophagia, melena, hematochezia, dysuria, hematuria, rash, seizure activity, orthopnea, PND, pedal edema, claudication. Remaining systems are negative.  Physical Exam: Well-developed well-nourished in no acute distress.  Skin is warm and dry.  HEENT is normal.  Neck is supple.  Chest is clear to auscultation with normal expansion.  Cardiovascular exam is regular rate and rhythm.  Abdominal exam nontender or distended. No masses palpated. Extremities show no edema. neuro grossly intact  ECG-normal sinus rhythm at a rate of 56, first-degree AV block, no ST changes.  Personally reviewed  A/P  1 mitral valve prolapse/mitral regurgitation-patient notes some dyspnea on exertion.  Will plan to repeat echocardiogram to reassess.  2 hypertension-patient's blood pressure is elevated; she will follow this and we will advance regimen as needed.  3 hyperlipidemia-continue statin.  4 coronary calcification-patient is not having chest pain.  Continue statin.  Olga Millers, MD

## 2022-08-24 ENCOUNTER — Ambulatory Visit: Payer: Medicare Other | Attending: Cardiology | Admitting: Cardiology

## 2022-08-24 ENCOUNTER — Encounter: Payer: Self-pay | Admitting: Cardiology

## 2022-08-24 VITALS — BP 148/62 | HR 56 | Ht 66.5 in | Wt 156.0 lb

## 2022-08-24 DIAGNOSIS — I059 Rheumatic mitral valve disease, unspecified: Secondary | ICD-10-CM | POA: Diagnosis present

## 2022-08-24 DIAGNOSIS — E7800 Pure hypercholesterolemia, unspecified: Secondary | ICD-10-CM

## 2022-08-24 DIAGNOSIS — I10 Essential (primary) hypertension: Secondary | ICD-10-CM | POA: Insufficient documentation

## 2022-08-24 NOTE — Patient Instructions (Signed)
  Testing/Procedures:  Your physician has requested that you have an echocardiogram. Echocardiography is a painless test that uses sound waves to create images of your heart. It provides your doctor with information about the size and shape of your heart and how well your heart's chambers and valves are working. This procedure takes approximately one hour. There are no restrictions for this procedure. Please do NOT wear cologne, perfume, aftershave, or lotions (deodorant is allowed). Please arrive 15 minutes prior to your appointment time. [facility]-1ST FLOOR IMAGING DEPARTMENT   Follow-Up: At [facility], you and your health needs are our priority.  As part of our continuing mission to provide you with exceptional heart care, we have created designated Provider Care Teams.  These Care Teams include your primary Cardiologist (physician) and Advanced Practice Providers (APPs -  Physician Assistants and Nurse Practitioners) who all work together to provide you with the care you need, when you need it.  We recommend signing up for the patient portal called "MyChart".  Sign up information is provided on this After Visit Summary.  MyChart is used to connect with patients for Virtual Visits (Telemedicine).  Patients are able to view lab/test results, encounter notes, upcoming appointments, etc.  Non-urgent messages can be sent to your provider as well.   To learn more about what you can do with MyChart, go to [email or web address].    Your next appointment:   12 month(s)  Provider:   Brian Crenshaw, MD     

## 2022-08-24 NOTE — Addendum Note (Signed)
Addended by: Silvia Markuson W on: 08/24/2022 01:28 PM   Modules accepted: Orders  

## 2022-09-21 ENCOUNTER — Encounter: Payer: Self-pay | Admitting: Emergency Medicine

## 2022-09-21 ENCOUNTER — Ambulatory Visit
Admission: EM | Admit: 2022-09-21 | Discharge: 2022-09-21 | Disposition: A | Discharge status: Home / Self Care | Payer: Medicare Other | Attending: Family Medicine | Admitting: Family Medicine

## 2022-09-21 ENCOUNTER — Ambulatory Visit (HOSPITAL_BASED_OUTPATIENT_CLINIC_OR_DEPARTMENT_OTHER)
Admission: RE | Admit: 2022-09-21 | Discharge: 2022-09-21 | Disposition: A | Discharge status: Home / Self Care | Payer: Medicare Other | Source: Ambulatory Visit | Attending: Cardiology | Admitting: Cardiology

## 2022-09-21 DIAGNOSIS — J0100 Acute maxillary sinusitis, unspecified: Secondary | ICD-10-CM

## 2022-09-21 DIAGNOSIS — I340 Nonrheumatic mitral (valve) insufficiency: Secondary | ICD-10-CM | POA: Diagnosis not present

## 2022-09-21 DIAGNOSIS — I059 Rheumatic mitral valve disease, unspecified: Secondary | ICD-10-CM | POA: Insufficient documentation

## 2022-09-21 LAB — ECHOCARDIOGRAM COMPLETE
Area-P 1/2: 3.27 cm2
MV M vel: 6.35 m/s
MV Peak grad: 161.3 mmHg
P 1/2 time: 729 msec
Radius: 0.3 cm
S' Lateral: 4.4 cm

## 2022-09-21 MED ORDER — AMOXICILLIN-POT CLAVULANATE 875-125 MG PO TABS: ORAL_TABLET | ORAL | 0 refills | Status: DC

## 2022-09-21 NOTE — ED Triage Notes (Signed)
Patient presents to Urgent Care with complaints of ear fullness, nasal congestion, cough-green sputum since 1 week ago. Patient reports when she went to [place] to visit her daughter. Started sinus congestion. Did a home covid test was negative.  Took Mucinex for sinuses. She does usually get sinus infection here in the fall.

## 2022-09-21 NOTE — ED Provider Notes (Signed)
[facility]    CSN: [id number] Arrival date & time: 09/21/22  1528      History   Chief Complaint Chief Complaint  Patient presents with   Nasal Congestion   Cough    HPI Kristi Wiley is a 82 y.o. female.   Patient visited her daughter in [place] one week ago.  While there she developed nasal congestion, cough, sinus congestion and ear fullness.  Her cough has improved but upon flying home she had difficulty equalizing pressure in her ears.  She now has mucopurulent green nasal drainage.  She denies shortness of breath and pleuritic pain.  She had a negative home COVID test.  The history is provided by the patient.    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   History of right hip replacement 05/17/2018   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   Pain of right hip joint 06/19/2017   Unilateral primary osteoarthritis, 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   Unilateral primary osteoarthritis, 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    Past Surgical History:  Procedure Laterality Date   ABDOMINAL HYSTERECTOMY  1986   BREAST SURGERY Right 2000   reconstructive     CATARACT EXTRACTION, BILATERAL  2015   eye cosmetic  Bilateral    [facility]    MOHS 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      Home Medications    Prior to Admission medications   Medication Sig Start Date End Date Taking? Authorizing Provider  amLODipine (NORVASC) 2.5 MG tablet Take 2.5 mg by mouth daily. 06/10/22  Yes [provider]  amoxicillin-clavulanate (AUGMENTIN) 875-125 MG tablet Take one tab PO Q12hr with food 09/21/22  Yes Lattie Haw, MD  Apoaequorin (PREVAGEN) 10 MG CAPS  07/17/22  Yes [provider]  aspirin EC 81 MG tablet Take 81 mg by mouth daily. Swallow whole.   Yes [provider]  Biotin 40981 MCG TABS Take 10,000 mcg by mouth daily after supper.    Yes [provider]  cholecalciferol (VITAMIN D) 1000 units tablet Take 2,000 Units by mouth daily.   Yes [provider]  FIBER PO Take 4.2 g by mouth daily. Metamucil  Yes [provider]  hydroxypropyl methylcellulose / hypromellose (ISOPTO TEARS / GONIOVISC) 2.5 % ophthalmic solution Place 1 drop into both eyes 3 (three) times daily as needed for dry eyes.   Yes [provider]  labetalol (NORMODYNE) 200 MG tablet Take 200 mg by mouth 2 (two) times daily.   Yes [provider]  losartan-hydrochlorothiazide (HYZAAR) 100-12.5 MG tablet Take 1 tablet by mouth daily at 12 noon. Mid day   Yes [provider]  Multiple Vitamin (MULTIVITAMIN WITH MINERALS) TABS tablet Take 1 tablet by mouth daily. Centrum Silver for Women   Yes [provider]  Omega-3 Fatty Acids (FISH OIL) 1000 MG  CAPS Take 1,000 mg by mouth daily with supper.    Yes [provider]  OVER THE COUNTER MEDICATION Take 1 tablet by mouth 2 (two) times daily. Viviscal Advanced Hair Health   Yes [provider]  pravastatin (PRAVACHOL) 40 MG tablet Take 40 mg by mouth at bedtime.    Yes [provider]  psyllium (METAMUCIL) 58.6 % packet Take by mouth daily.   Yes [provider]    Family History Family History  Problem Relation Age of Onset   CAD Father     Social History Social History   Tobacco Use   Smoking status: Never   Smokeless tobacco: Never  Vaping Use   Vaping Use: Never used  Substance Use Topics   Alcohol use: Yes    Comment: rare , wine     Drug use: Never     Allergies   Nitrofurantoin, Sulfonamide derivatives, and Latex   Review of Systems Review of Systems No sore throat + cough No pleuritic pain No wheezing + nasal congestion + post-nasal drainage + sinus pain/pressure No itchy/red eyes ? earache No hemoptysis No SOB No fever No nausea No vomiting No abdominal pain No diarrhea No urinary symptoms No skin rash + fatigue No myalgias No headache Used OTC meds (Mucinex) without relief   Physical Exam Triage Vital Signs ED Triage Vitals  Enc Vitals Group     BP 09/21/22 1549 124/69     Pulse Rate 09/21/22 1549 63     Resp 09/21/22 1549 16     Temp 09/21/22 1549 99.1 F (37.3 C)     Temp Source 09/21/22 1549 Oral     SpO2 09/21/22 1549 93 %     Weight --      Height --      Head Circumference --      Peak Flow --      Pain Score 09/21/22 1547 0     Pain Loc --      Pain Edu? --      Excl. in GC? --    No data found.  Updated Vital Signs BP 124/69 (BP Location: Left Arm)   Pulse 63   Temp 99.1 F (37.3 C) (Oral)   Resp 16   SpO2 93%   Visual Acuity Right Eye Distance:   Left Eye Distance:   Bilateral Distance:    Right Eye Near:   Left Eye Near:    Bilateral Near:     Physical Exam Nursing  notes and Vital Signs reviewed. Appearance:  Patient appears stated age, and in no acute distress Eyes:  Pupils are equal, round, and reactive to light and accomodation.  Extraocular movement is intact.  Conjunctivae are not inflamed  Ears:  Canals normal.  Tympanic membranes normal.  Nose:  Mildly congested turbinates.  Maxillary sinus tenderness is present.  Pharynx:  Normal Neck:  Supple.  Mildly enlarged lateral nodes are present, tender to palpation on the left.   Lungs:  Clear to auscultation.  Breath sounds are equal.  Moving air well. Heart:  Regular rate and rhythm without murmurs, rubs, or gallops.  Abdomen:  Nontender without masses or hepatosplenomegaly.  Bowel sounds are present.  No CVA or flank tenderness.  Extremities:  No edema.  Skin:  No rash present.   UC Treatments / Results  Labs (all labs ordered are listed, but only abnormal results are displayed) Labs Reviewed - No data to display  EKG   Radiology   Procedures Procedures (including critical care time)  Medications Ordered in UC Medications - No data to display  Initial Impression / Assessment and Plan / UC Course  I have reviewed the triage vital signs and the nursing notes.  Pertinent labs & imaging results that were available during my care of the patient were reviewed by me and considered in my medical decision making ([facility] chart for details).    Begin Augmentin for one week. Followup with Family Doctor if not improved in one week.   Final Clinical Impressions(s) / UC Diagnoses   Final diagnoses:  Acute maxillary sinusitis, recurrence not specified     Discharge Instructions      Take plain guaifenesin (1200mg  extended release tabs such as Mucinex) twice daily, with plenty of water, for cough and congestion.  Get adequate rest.   May use Afrin nasal spray (or generic oxymetazoline) each morning for about 5 days and then discontinue.  Also recommend using saline nasal spray several times  daily and saline nasal irrigation (AYR is a common brand).  Use Flonase nasal spray each morning after using Afrin nasal spray and saline nasal irrigation.   If symptoms become significantly worse during the night or over the weekend, proceed to the local emergency room.     ED Prescriptions     Medication Sig Dispense Auth. Provider   amoxicillin-clavulanate (AUGMENTIN) 875-125 MG tablet Take one tab PO Q12hr with food 14 tablet Lattie Haw, MD         Lattie Haw, MD 09/22/22 1046

## 2022-09-21 NOTE — Discharge Instructions (Signed)
Take plain guaifenesin (1200mg  extended release tabs such as Mucinex) twice daily, with plenty of water, for cough and congestion.  Get adequate rest.   May use Afrin nasal spray (or generic oxymetazoline) each morning for about 5 days and then discontinue.  Also recommend using saline nasal spray several times daily and saline nasal irrigation (AYR is a common brand).  Use Flonase nasal spray each morning after using Afrin nasal spray and saline nasal irrigation.   If symptoms become significantly worse during the night or over the weekend, proceed to the local emergency room.

## 2022-09-22 ENCOUNTER — Telehealth: Payer: Self-pay | Admitting: *Deleted

## 2022-09-22 NOTE — Telephone Encounter (Signed)
Left message for pt to call.

## 2022-09-22 NOTE — Telephone Encounter (Signed)
-----   Message from Lewayne Bunting, MD sent at 09/21/2022 12:32 PM EDT ----- Normal LV function; moderate to severe MR; schedule fuov Olga Millers

## 2022-09-23 NOTE — Telephone Encounter (Signed)
pt aware of results  Follow up scheduled  

## 2022-09-27 LAB — HM MAMMOGRAPHY

## 2022-10-14 ENCOUNTER — Ambulatory Visit
Admission: EM | Admit: 2022-10-14 | Discharge: 2022-10-14 | Disposition: A | Discharge status: Home / Self Care | Payer: Medicare Other | Source: Home / Self Care | Attending: Family Medicine | Admitting: Family Medicine

## 2022-10-14 DIAGNOSIS — Z1152 Encounter for screening for COVID-19: Secondary | ICD-10-CM | POA: Diagnosis not present

## 2022-10-14 DIAGNOSIS — R059 Cough, unspecified: Secondary | ICD-10-CM | POA: Diagnosis present

## 2022-10-14 DIAGNOSIS — J069 Acute upper respiratory infection, unspecified: Secondary | ICD-10-CM | POA: Diagnosis not present

## 2022-10-14 LAB — POC SARS CORONAVIRUS 2 AG -  ED: SARS Coronavirus 2 Ag: NEGATIVE

## 2022-10-14 MED ORDER — DOXYCYCLINE HYCLATE 100 MG PO CAPS: ORAL_CAPSULE | ORAL | 0 refills | Status: DC

## 2022-10-14 MED ORDER — ERYTHROMYCIN 5 MG/GM OP OINT: TOPICAL_OINTMENT | OPHTHALMIC | 0 refills | Status: DC

## 2022-10-14 NOTE — ED Provider Notes (Signed)
[facility]    CSN: [id number] Arrival date & time: 10/14/22  1721      History   Chief Complaint Chief Complaint  Patient presents with   Cough    HPI Kristi Wiley is a 82 y.o. female.   Three days ago patient suddenly developed a productive cough, hoarseness, and left eye redness/drainage.  She denies nasal congestion, sore throat, fever, pleuritic pain, and shortness of breath.  The history is provided by the patient.    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   History of right hip replacement 05/17/2018   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   Pain of right hip joint 06/19/2017   Unilateral primary osteoarthritis, 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   Unilateral primary osteoarthritis, 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    Past Surgical History:  Procedure Laterality Date   ABDOMINAL HYSTERECTOMY  1986   BREAST SURGERY Right 2000   reconstructive     CATARACT EXTRACTION, BILATERAL  2015   eye cosmetic  Bilateral    [place]    MOHS SURGERY  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      Home Medications    Prior to Admission medications   Medication Sig Start Date End Date Taking? Authorizing Provider  amLODipine (NORVASC) 2.5 MG tablet Take 2.5 mg by mouth daily. 06/10/22  Yes [provider]  Apoaequorin (PREVAGEN) 10 MG CAPS  07/17/22  Yes [provider]  aspirin EC 81 MG tablet Take 81 mg by mouth daily. Swallow whole.   Yes [provider]  Biotin 47829 MCG TABS Take 10,000 mcg by mouth daily after supper.    Yes [provider]  cholecalciferol (VITAMIN D) 1000 units tablet Take 2,000 Units by mouth daily.   Yes [provider]  doxycycline (VIBRAMYCIN) 100 MG capsule Take one cap PO Q12hr with food. 10/14/22  Yes Lattie Haw, MD  FIBER PO Take 4.2 g by mouth daily. Metamucil   Yes [provider]  hydroxypropyl methylcellulose / hypromellose (ISOPTO TEARS / GONIOVISC) 2.5 % ophthalmic solution Place 1 drop into both eyes 3 (three) times daily as needed for dry eyes.   Yes [provider]  labetalol (  NORMODYNE) 200 MG tablet Take 200 mg by mouth 2 (two) times daily.   Yes [provider]  losartan-hydrochlorothiazide (HYZAAR) 100-12.5 MG tablet Take 1 tablet by mouth daily at 12 noon. Mid day   Yes [provider]  Multiple Vitamin (MULTIVITAMIN WITH MINERALS) TABS tablet Take 1 tablet by mouth daily. Centrum Silver for Women   Yes [provider]  Omega-3 Fatty Acids (FISH OIL) 1000 MG CAPS Take 1,000 mg by mouth daily with supper.    Yes [provider]  OVER THE COUNTER MEDICATION Take 1 tablet by mouth 2 (two) times daily. Viviscal Advanced Hair Health   Yes [provider]   pravastatin (PRAVACHOL) 40 MG tablet Take 40 mg by mouth at bedtime.    Yes [provider]  psyllium (METAMUCIL) 58.6 % packet Take by mouth daily.   Yes [provider]    Family History Family History  Problem Relation Age of Onset   CAD Father     Social History Social History   Tobacco Use   Smoking status: Never   Smokeless tobacco: Never  Vaping Use   Vaping status: Never Used  Substance Use Topics   Alcohol use: Yes    Comment: rare , wine     Drug use: Never     Allergies   Nitrofurantoin, Sulfonamide derivatives, and Latex   Review of Systems Review of Systems No sore throat + hoarseness + cough No pleuritic pain No wheezing No nasal congestion + post-nasal drainage No sinus pain/pressure No itchy/red eyes No earache No hemoptysis No SOB No fever/chills No nausea No vomiting No abdominal pain No diarrhea No urinary symptoms No skin rash + fatigue No myalgias No headache   Physical Exam Triage Vital Signs ED Triage Vitals  Encounter Vitals Group     BP 10/14/22 1735 (!) 132/57     Systolic BP Percentile --      Diastolic BP Percentile --      Pulse Rate 10/14/22 1735 63     Resp 10/14/22 1735 18     Temp 10/14/22 1735 98.9 F (37.2 C)     Temp Source 10/14/22 1735 Oral     SpO2 10/14/22 1735 94 %     Weight --      Height --      Head Circumference --      Peak Flow --      Pain Score 10/14/22 1737 0     Pain Loc --      Pain Education --      Exclude from Growth Chart --    No data found.  Updated Vital Signs BP (!) 132/57 (BP Location: Left Arm)   Pulse 63   Temp 98.9 F (37.2 C) (Oral)   Resp 18   SpO2 94%   Visual Acuity Right Eye Distance:   Left Eye Distance:   Bilateral Distance:    Right Eye Near:   Left Eye Near:    Bilateral Near:     Physical Exam Nursing notes and Vital Signs reviewed. Appearance:  Patient appears stated age, and in no acute distress Eyes:  Pupils are equal,  round, and reactive to light and accomodation.  Extraocular movement is intact.  Left conjunctivae mildly injected with mucoid discharge present. Ears:  Canals normal.  Tympanic membranes normal.  Nose:  Mildly congested turbinates.  No sinus tenderness.   Pharynx:  Normal Neck:  Supple.  Mildly enlarged lateral nodes are present, tender to  palpation on the left.   Lungs:  Bilateral rhonchi present posteriorly.  Breath sounds are equal.  Moving air well. Heart:  Regular rate and rhythm without murmurs, rubs, or gallops.  Abdomen:  Nontender without masses or hepatosplenomegaly.  Bowel sounds are present.  No CVA or flank tenderness.  Extremities:  No edema.  Skin:  No rash present.   UC Treatments / Results  Labs (all labs ordered are listed, but only abnormal results are displayed) Labs Reviewed  SARS CORONAVIRUS 2 (TAT 6-24 HRS)    EKG   Radiology No results found.  Procedures Procedures (including critical care time)  Medications Ordered in UC Medications - No data to display  Initial Impression / Assessment and Plan / UC Course  I have reviewed the triage vital signs and the nursing notes.  Pertinent labs & imaging results that were available during my care of the patient were reviewed by me and considered in my medical decision making ([facility] chart for details).    Patient may be developing bronchitis with history of discolored sputum and rhonchi on chest exam.  Note history of breast CA. Begin doxycycline.  Begin erythromycin ophthalmic suspension to left eye. Check COVID  PCR Followup with Family Doctor if not improved in one week.   Final Clinical Impressions(s) / UC Diagnoses   Final diagnoses:  Viral URI with cough     Discharge Instructions      Take plain guaifenesin (1200mg  extended release tabs such as Mucinex) twice daily, with plenty of water, for cough and congestion.  Get adequate rest.   May take Delsym Cough Suppressant ("12 Hour Cough Relief") at  bedtime for nighttime cough.  Stop all antihistamines for now, and other non-prescription cough/cold preparations.  If symptoms become significantly worse during the night or over the weekend, proceed to the local emergency room.      ED Prescriptions     Medication Sig Dispense Auth. Provider   doxycycline (VIBRAMYCIN) 100 MG capsule Take one cap PO Q12hr with food. 14 capsule Lattie Haw, MD         Lattie Haw, MD 10/15/22 2032

## 2022-10-14 NOTE — Discharge Instructions (Signed)
Take plain guaifenesin (1200mg  extended release tabs such as Mucinex) twice daily, with plenty of water, for cough and congestion.   Get adequate rest.   May take Delsym Cough Suppressant ("12 Hour Cough Relief") at bedtime for nighttime cough.  Stop all antihistamines for now, and other non-prescription cough/cold preparations.     If symptoms become significantly worse during the night or over the weekend, proceed to the local emergency room.

## 2022-10-14 NOTE — ED Triage Notes (Signed)
Patient c/o productive cough, chest congestion, laryngitis x 3 days.  Denies fever.  Denies ear pain.  Left eye redness w/drainage.  Patient had been taken Mucinex, OTC cough meds.

## 2022-10-16 LAB — SARS CORONAVIRUS 2 (TAT 6-24 HRS): SARS Coronavirus 2: NEGATIVE

## 2022-11-11 ENCOUNTER — Ambulatory Visit (INDEPENDENT_AMBULATORY_CARE_PROVIDER_SITE_OTHER): Payer: Medicare Other | Admitting: Family Medicine

## 2022-11-11 ENCOUNTER — Encounter: Payer: Self-pay | Admitting: Family Medicine

## 2022-11-11 VITALS — BP 128/64 | HR 61 | Temp 98.3°F | Ht 66.5 in | Wt 151.0 lb

## 2022-11-11 DIAGNOSIS — H6993 Unspecified Eustachian tube disorder, bilateral: Secondary | ICD-10-CM

## 2022-11-11 DIAGNOSIS — R7989 Other specified abnormal findings of blood chemistry: Secondary | ICD-10-CM | POA: Diagnosis not present

## 2022-11-11 DIAGNOSIS — E7800 Pure hypercholesterolemia, unspecified: Secondary | ICD-10-CM

## 2022-11-11 DIAGNOSIS — I10 Essential (primary) hypertension: Secondary | ICD-10-CM

## 2022-11-11 LAB — BASIC METABOLIC PANEL
BUN: 27 mg/dL — ABNORMAL HIGH (ref 6–23)
CO2: 31 mEq/L (ref 19–32)
Calcium: 9.6 mg/dL (ref 8.4–10.5)
Chloride: 98 mEq/L (ref 96–112)
Creatinine, Ser: 1.39 mg/dL — ABNORMAL HIGH (ref 0.40–1.20)
GFR: 35.15 mL/min — ABNORMAL LOW (ref >60.00)
Glucose, Bld: 86 mg/dL (ref 70–99)
Potassium: 3.7 mEq/L (ref 3.5–5.1)
Sodium: 135 mEq/L (ref 135–145)

## 2022-11-11 LAB — LDL CHOLESTEROL, DIRECT: Direct LDL: 77 mg/dL

## 2022-11-11 LAB — TSH: TSH: 2.5 u[IU]/mL (ref 0.35–5.50)

## 2022-11-11 MED ORDER — FLUTICASONE PROPIONATE 50 MCG/ACT NA SUSP
2.0000 | Freq: Every day | NASAL | 6 refills | Status: DC
Start: 2022-11-11 — End: 2023-01-05

## 2022-11-11 MED ORDER — PRAVASTATIN SODIUM 40 MG PO TABS: 40.0000 mg | ORAL_TABLET | Freq: Every day | ORAL | 2 refills | Status: DC

## 2022-11-11 MED ORDER — LOSARTAN POTASSIUM-HCTZ 100-12.5 MG PO TABS
1.0000 | ORAL_TABLET | Freq: Every day | ORAL | 1 refills | Status: DC
Start: 2022-11-11 — End: 2023-10-18

## 2022-11-11 NOTE — Progress Notes (Signed)
New Patient Office Visit  Subjective    Patient ID: Kristi Wiley, female    DOB: Apr 19, 1940  Age: 82 y.o. MRN: [id number]  CC:  Chief Complaint  Patient presents with   Establish Care    Possible Sinusitis  Medication refill    HPI Kristi Wiley presents to establish care. Encounter Diagnoses  Name Primary?   Essential hypertension Yes   Elevated LDL cholesterol level    Elevated TSH    Dysfunction of both eustachian tubes    Essential (primary) hypertension    For follow-up of above.  Gives a history of ongoing intermittent nasal congestion and postnasal drip and ear congestion.  Initially treated with amoxicillin.  Then several weeks later received a course of doxycycline.  She no longer suffers from nasal congestion rhinorrhea fever chills cough or difficulty breathing.  She has ongoing intermittent ear congestion.  Chewing gum the other day seem to relieve.  History of hypertension controlled with amlodipine and Hyzaar.  History of elevated cholesterol treated with pravastatin 40 mg.  Chart review shows a mildly elevated TSH.  Outpatient Encounter Medications as of 11/11/2022  Medication Sig   amLODipine (NORVASC) 2.5 MG tablet Take 2.5 mg by mouth daily.   Apoaequorin (PREVAGEN) 10 MG CAPS    aspirin EC 81 MG tablet Take 81 mg by mouth daily. Swallow whole.   Biotin 19147 MCG TABS Take 10,000 mcg by mouth daily after supper.    cholecalciferol (VITAMIN D) 1000 units tablet Take 2,000 Units by mouth daily.   FIBER PO Take 4.2 g by mouth daily. Metamucil   fluticasone (FLONASE) 50 MCG/ACT nasal spray Place 2 sprays into both nostrils daily.   hydroxypropyl methylcellulose / hypromellose (ISOPTO TEARS / GONIOVISC) 2.5 % ophthalmic solution Place 1 drop into both eyes 3 (three) times daily as needed for dry eyes.   labetalol (NORMODYNE) 200 MG tablet Take 200 mg by mouth 2 (two) times daily.   Multiple Vitamin (MULTIVITAMIN WITH MINERALS) TABS tablet Take 1 tablet by mouth daily.  Centrum Silver for Women   Omega-3 Fatty Acids (FISH OIL) 1000 MG CAPS Take 1,000 mg by mouth daily with supper.    OVER THE COUNTER MEDICATION Take 1 tablet by mouth 2 (two) times daily. Viviscal [employer] [facility]   psyllium (METAMUCIL) 58.6 % packet Take by mouth daily.   [DISCONTINUED] losartan-hydrochlorothiazide (HYZAAR) 100-12.5 MG tablet Take 1 tablet by mouth daily at 12 noon. Mid day   [DISCONTINUED] pravastatin (PRAVACHOL) 40 MG tablet Take 40 mg by mouth at bedtime.    losartan-hydrochlorothiazide (HYZAAR) 100-12.5 MG tablet Take 1 tablet by mouth daily at 12 noon. Mid day   pravastatin (PRAVACHOL) 40 MG tablet Take 1 tablet (40 mg total) by mouth at bedtime.   [DISCONTINUED] doxycycline (VIBRAMYCIN) 100 MG capsule Take one cap PO Q12hr with food.   [DISCONTINUED] erythromycin ophthalmic ointment Place a 1/2 inch ribbon of ointment into the lower eyelid q3 to 4hr   No facility-administered encounter medications on file as of 11/11/2022.    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   Unilateral primary osteoarthritis, 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    Past Surgical History:  Procedure Laterality Date   ABDOMINAL HYSTERECTOMY  1986   BREAST SURGERY Right 2000   reconstructive     CATARACT EXTRACTION, BILATERAL  2015   eye cosmetic  Bilateral    [place]    MOHS SURGERY  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    TONSILLECTOMY     TOTAL HIP ARTHROPLASTY Right 10/06/2017   Procedure: RIGHT TOTAL HIP ARTHROPLASTY ANTERIOR APPROACH;  Surgeon: Kathryne Hitch,  MD;  Location: [facility];  Service: Orthopedics;  Laterality: Right;   TOTAL HIP ARTHROPLASTY Left 09/06/2019   Procedure: LEFT TOTAL HIP ARTHROPLASTY ANTERIOR APPROACH;  Surgeon: Kathryne Hitch, MD;  Location: [facility];  Service: Orthopedics;  Laterality: Left;   TOTAL MASTECTOMY Right 01/24/1996    Family History  Problem Relation Age of Onset   CAD Father     Social History   Socioeconomic History   Marital status: Widowed    Spouse name: Not on file   Number of children: Not on file   Years of education: Not on file   Highest education level: Not on file  Occupational History   Not on file  Tobacco Use   Smoking status: Never   Smokeless tobacco: Never  Vaping Use   Vaping status: Never Used  Substance and Sexual Activity   Alcohol use: Yes    Comment: rare , wine     Drug use: Never   Sexual activity: Not on file  Other Topics Concern   Not on file  Social History Narrative   Not on file   Social Determinants of Health   Financial Resource Strain: Not on file  Food Insecurity: No Food Insecurity (12/03/2021)   Received [facility], [facility]   Hunger Vital Sign    Worried About Running Out of Food in the Last Year: Never true    Ran Out of Food in the Last Year: Never true  Transportation Needs: Not on file  Physical Activity: Not on file  Stress: Not on file  Social Connections: Unknown (09/05/2021)   Received from [facility], [facility]   Social Network    Social Network: Not on file  Intimate Partner Violence: Unknown (09/05/2021)   Received from [facility], [facility]   HITS    Physically Hurt: Not on file    Insult or Talk Down To: Not on file    Threaten Physical Harm: Not on file    Scream or Curse: Not on file    Review of Systems  Constitutional: Negative.  Negative for chills and fever.  HENT: Negative.  Negative for congestion and sinus pain.   Eyes:  Negative for blurred vision, discharge and redness.  Respiratory:  Negative.  Negative for cough, sputum production, shortness of breath and wheezing.   Cardiovascular: Negative.   Gastrointestinal:  Negative for abdominal pain.  Genitourinary: Negative.   Musculoskeletal: Negative.  Negative for myalgias.  Skin:  Negative for rash.  Neurological:  Negative for tingling, loss of consciousness and weakness.  Endo/Heme/Allergies:  Negative for polydipsia.        Objective    BP 128/64   Pulse 61   Temp 98.3 F (36.8 C) (Temporal)   Ht 5' 6.5" (1.689 m)   Wt 151 lb (68.5 kg)   SpO2 98%  BMI 24.01 kg/m   Physical Exam Constitutional:      General: She is not in acute distress.    Appearance: Normal appearance. She is not ill-appearing, toxic-appearing or diaphoretic.  HENT:     Head: Normocephalic and atraumatic.     Right Ear: External ear normal. No middle ear effusion. Tympanic membrane is retracted. Tympanic membrane is not injected or erythematous.     Left Ear: External ear normal.  No middle ear effusion. Tympanic membrane is retracted. Tympanic membrane is not injected or erythematous.     Mouth/Throat:     Mouth: Mucous membranes are moist.     Pharynx: Oropharynx is clear. No oropharyngeal exudate or posterior oropharyngeal erythema.  Eyes:     General: No scleral icterus.       Right eye: No discharge.        Left eye: No discharge.     Extraocular Movements: Extraocular movements intact.     Conjunctiva/sclera: Conjunctivae normal.     Pupils: Pupils are equal, round, and reactive to light.  Cardiovascular:     Rate and Rhythm: Normal rate and regular rhythm.  Pulmonary:     Effort: Pulmonary effort is normal. No respiratory distress.     Breath sounds: Normal breath sounds. No wheezing, rhonchi or rales.  Abdominal:     General: Bowel sounds are normal.     Tenderness: There is no abdominal tenderness. There is no guarding.  Musculoskeletal:     Cervical back: No rigidity or tenderness.  Skin:    General: Skin is warm  and dry.  Neurological:     Mental Status: She is alert and oriented to person, place, and time.  Psychiatric:        Mood and Affect: Mood normal.        Behavior: Behavior normal.         Assessment & Plan:   Essential hypertension -     Losartan Potassium-HCTZ; Take 1 tablet by mouth daily at 12 noon. Mid day  Dispense: 90 tablet; Refill: 1 -     Basic metabolic panel  Elevated LDL cholesterol level -     LDL cholesterol, direct -     Pravastatin Sodium; Take 1 tablet (40 mg total) by mouth at bedtime.  Dispense: 90 tablet; Refill: 2  Elevated TSH -     TSH  Dysfunction of both eustachian tubes -     Fluticasone Propionate; Place 2 sprays into both nostrils daily.  Dispense: 16 g; Refill: 6  Essential (primary) hypertension -     TSH     Return in about 8 weeks (around 01/06/2023).  Will start Flonase.  Rechecking LDL cholesterol.  Continue current regimen for hypertension.  Rechecking TSH.  Follow-up in 2 months  Mliss Sax, MD

## 2022-11-15 NOTE — Progress Notes (Signed)
HPI: FU mitral valve prolapse. Previously followed by Dr. Bary Castilla. Echocardiogram April 2021 showed normal LV function, grade 1 diastolic dysfunction, moderate left atrial enlargement, mild right atrial enlargement, mild mitral regurgitation.  Calcium score April 2021 20.  Echocardiogram June 2024 showed normal LV function, moderate left atrial enlargement, moderate to severe mitral regurgitation.  Since last seen the patient denies any dyspnea on exertion, orthopnea, PND, pedal edema, palpitations, syncope or chest pain.   Current Outpatient Medications  Medication Sig Dispense Refill   amLODipine (NORVASC) 2.5 MG tablet Take 2.5 mg by mouth daily.     Apoaequorin (PREVAGEN) 10 MG CAPS      aspirin EC 81 MG tablet Take 81 mg by mouth daily. Swallow whole.     Biotin 04540 MCG TABS Take 10,000 mcg by mouth daily after supper.      cholecalciferol (VITAMIN D) 1000 units tablet Take 2,000 Units by mouth daily.     FIBER PO Take 4.2 g by mouth daily. Metamucil     hydroxypropyl methylcellulose / hypromellose (ISOPTO TEARS / GONIOVISC) 2.5 % ophthalmic solution Place 1 drop into both eyes 3 (three) times daily as needed for dry eyes.     labetalol (NORMODYNE) 200 MG tablet Take 200 mg by mouth 2 (two) times daily.     losartan-hydrochlorothiazide (HYZAAR) 100-12.5 MG tablet Take 1 tablet by mouth daily at 12 noon. Mid day 90 tablet 1   Multiple Vitamin (MULTIVITAMIN WITH MINERALS) TABS tablet Take 1 tablet by mouth daily. Centrum Silver for Women     Omega-3 Fatty Acids (FISH OIL) 1000 MG CAPS Take 1,000 mg by mouth daily with supper.      OVER THE COUNTER MEDICATION Take 1 tablet by mouth 2 (two) times daily. Viviscal [facility] [employer] [facility]     pravastatin (PRAVACHOL) 40 MG tablet Take 1 tablet (40 mg total) by mouth at bedtime. 90 tablet 2   psyllium (METAMUCIL) 58.6 % packet Take by mouth daily.     fluticasone (FLONASE) 50 MCG/ACT nasal spray Place 2 sprays into both nostrils daily. (Patient  not taking: Reported on 11/21/2022) 16 g 6   No current facility-administered medications for this visit.     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    Past Surgical History:  Procedure Laterality Date   ABDOMINAL HYSTERECTOMY  1986   BREAST SURGERY Right 2000   reconstructive     CATARACT EXTRACTION, BILATERAL  2015   eye cosmetic  Bilateral    [place]    MOHS SURGERY  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    TONSILLECTOMY     TOTAL HIP ARTHROPLASTY Right 10/06/2017   Procedure: RIGHT TOTAL HIP ARTHROPLASTY ANTERIOR APPROACH;  Surgeon: Kathryne Hitch, MD;  Location: [facility];  Service: Orthopedics;  Laterality: Right;   TOTAL HIP ARTHROPLASTY Left 09/06/2019   Procedure: LEFT TOTAL HIP ARTHROPLASTY ANTERIOR APPROACH;  Surgeon: Doneen Poisson  Y, MD;  Location: [facility];  Service: Orthopedics;  Laterality: Left;   TOTAL MASTECTOMY Right 01/24/1996    Social History   Socioeconomic History   Marital status: Widowed    Spouse name: Not on file   Number of children: Not on file   Years of education: Not on file   Highest education level: Not on file  Occupational History   Not on file  Tobacco Use   Smoking status: Never   Smokeless tobacco: Never  Vaping Use   Vaping status: Never Used  Substance and Sexual Activity   Alcohol use: Yes    Comment: rare , wine     Drug use: Never   Sexual activity: Not on file  Other Topics Concern   Not on file  Social History Narrative   Not  on file   Social Determinants of Health   Financial Resource Strain: Not on file  Food Insecurity: No Food Insecurity (12/03/2021)   Received from [facility], [facility]   Hunger Vital Sign    Worried About Running Out of Food in the Last Year: Never true    Ran Out of Food in the Last Year: Never true  Transportation Needs: Not on file  Physical Activity: Not on file  Stress: Not on file  Social Connections: Unknown (09/05/2021)   Received from [facility], [facility]   Social Network    Social Network: Not on file  Intimate Partner Violence: Unknown (09/05/2021)   Received from [facility], [facility]   HITS    Physically Hurt: Not on file    Insult or Talk Down To: Not on file    Threaten Physical Harm: Not on file    Scream or Curse: Not on file    Family History  Problem Relation Age of Onset   CAD Father     ROS: no fevers or chills, productive cough, hemoptysis, dysphasia, odynophagia, melena, hematochezia, dysuria, hematuria, rash, seizure activity, orthopnea, PND, pedal edema, claudication. Remaining systems are negative.  Physical Exam: Well-developed well-nourished in no acute distress.  Skin is warm and dry.  HEENT is normal.  Neck is supple.  Chest is clear to auscultation with normal expansion.  Cardiovascular exam is regular rate and rhythm.  Abdominal exam nontender or distended. No masses palpated. Extremities show no edema. neuro grossly intact  EKG Interpretation Date/Time:  Monday November 21 2022 13:19:56 EDT Ventricular Rate:  56 PR Interval:  206 QRS Duration:  90 QT Interval:  446 QTC Calculation: 430 R Axis:   -5  Text Interpretation: Sinus bradycardia When compared with ECG of 02-Oct-2017 15:02, No significant change was found Confirmed by Olga Millers ([id number]) on 11/21/2022 1:21:45 PM    A/P  1 mitral valve prolapse/mitral regurgitation-I have reviewed the patient's recent echocardiogram and mitral regurgitation appears  to be moderate by my review.  She is asymptomatic with no dyspnea, chest pain or palpitations.  Will plan repeat echocardiogram 6/25.  Would like to be conservative if possible given patient's age.  2 hypertension-blood pressure controlled.  Continue present medications.  3 coronary calcification-continue statin.  She denies chest pain.  4 hyperlipidemia-continue statin.  Olga Millers, MD

## 2022-11-21 ENCOUNTER — Encounter: Payer: Self-pay | Admitting: Cardiology

## 2022-11-21 ENCOUNTER — Ambulatory Visit: Payer: Medicare Other | Attending: Cardiology | Admitting: Cardiology

## 2022-11-21 VITALS — BP 138/62 | HR 56 | Ht 66.5 in | Wt 151.0 lb

## 2022-11-21 DIAGNOSIS — E7800 Pure hypercholesterolemia, unspecified: Secondary | ICD-10-CM | POA: Diagnosis present

## 2022-11-21 DIAGNOSIS — I340 Nonrheumatic mitral (valve) insufficiency: Secondary | ICD-10-CM | POA: Diagnosis present

## 2022-11-21 DIAGNOSIS — I059 Rheumatic mitral valve disease, unspecified: Secondary | ICD-10-CM | POA: Diagnosis present

## 2022-11-21 DIAGNOSIS — I10 Essential (primary) hypertension: Secondary | ICD-10-CM | POA: Diagnosis present

## 2022-11-21 NOTE — Patient Instructions (Signed)
Medication Instructions:  *If you need a refill on your cardiac medications before your next appointment, please call your pharmacy*   Lab Work: If you have labs (blood work) drawn today and your tests are completely normal, you will receive your results only by: MyChart Message (if you have MyChart) OR A paper copy in the mail If you have any lab test that is abnormal or we need to change your treatment, we will call you to review the results.   Testing/Procedures: Your physician has requested that you have an echocardiogram. Echocardiography is a painless test that uses sound waves to create images of your heart. It provides your doctor with information about the size and shape of your heart and how well your heart's chambers and valves are working. This procedure takes approximately one hour. There are no restrictions for this procedure. Please do NOT wear perfume or lotions (deodorant is allowed). Please arrive 15 minutes prior to your appointment time.    Follow-Up: At [facility], you and your health needs are our priority.  As part of our continuing mission to provide you with exceptional heart care, we have created designated Provider Care Teams.  These Care Teams include your primary Cardiologist (physician) and Advanced Practice Providers (APPs -  Physician Assistants and Nurse Practitioners) who all work together to provide you with the care you need, when you need it.  We recommend signing up for the patient portal called "MyChart".  Sign up information is provided on this After Visit Summary.  MyChart is used to connect with patients for Virtual Visits (Telemedicine).  Patients are able to view lab/test results, encounter notes, upcoming appointments, etc.  Non-urgent messages can be sent to your provider as well.   To learn more about what you can do with MyChart, go to [email or web address].    Your next appointment:   1 year(s)  Provider:   Dr. Ivor Messier

## 2023-01-05 ENCOUNTER — Encounter: Payer: Self-pay | Admitting: Family Medicine

## 2023-01-05 ENCOUNTER — Ambulatory Visit (INDEPENDENT_AMBULATORY_CARE_PROVIDER_SITE_OTHER): Payer: Medicare Other | Admitting: Family Medicine

## 2023-01-05 VITALS — BP 157/66 | HR 52 | Temp 97.9°F | Ht 66.5 in | Wt 155.0 lb

## 2023-01-05 DIAGNOSIS — I10 Essential (primary) hypertension: Secondary | ICD-10-CM

## 2023-01-05 DIAGNOSIS — N183 Chronic kidney disease, stage 3 unspecified: Secondary | ICD-10-CM | POA: Diagnosis not present

## 2023-01-05 DIAGNOSIS — H6993 Unspecified Eustachian tube disorder, bilateral: Secondary | ICD-10-CM | POA: Diagnosis not present

## 2023-01-05 LAB — BASIC METABOLIC PANEL
BUN: 25 mg/dL — ABNORMAL HIGH (ref 6–23)
CO2: 34 meq/L — ABNORMAL HIGH (ref 19–32)
Calcium: 10.1 mg/dL (ref 8.4–10.5)
Chloride: 101 meq/L (ref 96–112)
Creatinine, Ser: 1.26 mg/dL — ABNORMAL HIGH (ref 0.40–1.20)
GFR: 39.5 mL/min — ABNORMAL LOW (ref >60.00)
Glucose, Bld: 73 mg/dL (ref 70–99)
Potassium: 3.8 meq/L (ref 3.5–5.1)
Sodium: 141 meq/L (ref 135–145)

## 2023-01-05 NOTE — Progress Notes (Signed)
Established Patient Office Visit   Subjective:  Patient ID: Kristi Wiley, female    DOB: [birth year]  Age: 82 y.o. MRN: [id number]  Chief Complaint  Patient presents with   Follow-up    8 week follow up for dysfunction of both eustachian tubes. Also wanted to ask about neuropathy.      HPI Encounter Diagnoses  Name Primary?   Dysfunction of both eustachian tubes Yes   Stage 3 chronic kidney disease, unspecified whether stage 3a or 3b CKD (HCC)    Essential hypertension    For follow-up of above.  After a few weeks on the Flonase, eustachian tube dysfunction was relieved.  Admits that when her last BMP was drawn, she had not been hydrating well.  She has since been drinking a lot of water to maintain her hydration.  History of mitral valve prolapse.  Blood pressure controlled with labetalol, Hyzaar and amlodipine   Review of Systems  Constitutional: Negative.   HENT: Negative.    Eyes:  Negative for blurred vision, discharge and redness.  Respiratory: Negative.    Cardiovascular: Negative.   Gastrointestinal:  Negative for abdominal pain.  Genitourinary: Negative.   Musculoskeletal: Negative.  Negative for myalgias.  Skin:  Negative for rash.  Neurological:  Negative for tingling, loss of consciousness and weakness.  Endo/Heme/Allergies:  Negative for polydipsia.     Current Outpatient Medications:    amLODipine (NORVASC) 2.5 MG tablet, Take 2.5 mg by mouth daily., Disp: , Rfl:    Apoaequorin (PREVAGEN) 10 MG CAPS, , Disp: , Rfl:    aspirin EC 81 MG tablet, Take 81 mg by mouth daily. Swallow whole., Disp: , Rfl:    Biotin 82956 MCG TABS, Take 10,000 mcg by mouth daily after supper. , Disp: , Rfl:    cholecalciferol (VITAMIN D) 1000 units tablet, Take 2,000 Units by mouth daily., Disp: , Rfl:    FIBER PO, Take 4.2 g by mouth daily. Metamucil, Disp: , Rfl:    labetalol (NORMODYNE) 200 MG tablet, Take 200 mg by mouth 2 (two) times daily., Disp: , Rfl:     losartan-hydrochlorothiazide (HYZAAR) 100-12.5 MG tablet, Take 1 tablet by mouth daily at 12 noon. Mid day, Disp: 90 tablet, Rfl: 1   Multiple Vitamin (MULTIVITAMIN WITH MINERALS) TABS tablet, Take 1 tablet by mouth daily. Centrum Silver for Women, Disp: , Rfl:    Omega-3 Fatty Acids (FISH OIL) 1000 MG CAPS, Take 1,000 mg by mouth daily with supper. , Disp: , Rfl:    OVER THE COUNTER MEDICATION, Take 1 tablet by mouth 2 (two) times daily. Viviscal [employer], Disp: , Rfl:    pravastatin (PRAVACHOL) 40 MG tablet, Take 1 tablet (40 mg total) by mouth at bedtime., Disp: 90 tablet, Rfl: 2   psyllium (METAMUCIL) 58.6 % packet, Take by mouth daily., Disp: , Rfl:    fluticasone (FLONASE) 50 MCG/ACT nasal spray, Place 2 sprays into both nostrils daily. (Patient not taking: Reported on 11/21/2022), Disp: 16 g, Rfl: 6   hydroxypropyl methylcellulose / hypromellose (ISOPTO TEARS / GONIOVISC) 2.5 % ophthalmic solution, Place 1 drop into both eyes 3 (three) times daily as needed for dry eyes. (Patient not taking: Reported on 01/05/2023), Disp: , Rfl:    Objective:     BP (!) 144/70   Pulse (!) 52   Temp 97.9 F (36.6 C) (Temporal)   Ht 5' 6.5" (1.689 m)   Wt 155 lb (70.3 kg)   SpO2 98%   BMI 24.64  kg/m  BP Readings from Last 3 Encounters:  01/05/23 (!) 144/70  11/21/22 138/62  11/11/22 128/64   Wt Readings from Last 3 Encounters:  01/05/23 155 lb (70.3 kg)  11/21/22 151 lb (68.5 kg)  11/11/22 151 lb (68.5 kg)      Physical Exam Constitutional:      General: She is not in acute distress.    Appearance: Normal appearance. She is not ill-appearing, toxic-appearing or diaphoretic.  HENT:     Head: Normocephalic and atraumatic.     Right Ear: External ear normal.     Left Ear: External ear normal.  Eyes:     General: No scleral icterus.       Right eye: No discharge.        Left eye: No discharge.     Extraocular Movements: Extraocular movements intact.     Conjunctiva/sclera:  Conjunctivae normal.  Cardiovascular:     Rate and Rhythm: Normal rate and regular rhythm.  Pulmonary:     Effort: Pulmonary effort is normal. No respiratory distress.     Breath sounds: No wheezing, rhonchi or rales.  Skin:    General: Skin is warm and dry.  Neurological:     Mental Status: She is alert and oriented to person, place, and time.  Psychiatric:        Mood and Affect: Mood normal.        Behavior: Behavior normal.      No results found for any visits on 01/05/23.    The ASCVD Risk score (Arnett DK, et al., 2019) failed to calculate for the following reasons:   The 2019 ASCVD risk score is only valid for ages 42 to 29    Assessment & Plan:   Dysfunction of both eustachian tubes  Stage 3 chronic kidney disease, unspecified whether stage 3a or 3b CKD (HCC) -     Basic metabolic panel  Essential hypertension    Return Recommended follow-up pends results of blood work..  Continue current medications.  Rechecking BMP.  Continue hydrating well.  Recommended the RSV and COVID vaccines through her pharmacy.   Mliss Sax, MD

## 2023-01-06 ENCOUNTER — Ambulatory Visit: Payer: Medicare Other

## 2023-01-06 ENCOUNTER — Ambulatory Visit: Payer: Medicare Other | Admitting: Family Medicine

## 2023-01-06 VITALS — BP 122/60 | HR 59 | Temp 98.9°F | Ht 65.5 in | Wt 154.6 lb

## 2023-01-06 DIAGNOSIS — Z0000 Encounter for general adult medical examination without abnormal findings: Secondary | ICD-10-CM

## 2023-01-06 NOTE — Patient Instructions (Signed)
Kristi Wiley , Thank you for taking time to come for your Medicare Wellness Visit. I appreciate your ongoing commitment to your health goals. Please review the following plan we discussed and let me know if I can assist you in the future.   Referrals/Orders/Follow-Ups/Clinician Recommendations: none  This is a list of the screening recommended for you and due dates:  Health Maintenance  Topic Date Due   COVID-19 Vaccine (1) Never done   DTaP/Tdap/Td vaccine (1 - Tdap) Never done   Zoster (Shingles) Vaccine (1 of 2) Never done   Pneumonia Vaccine (1 of 1 - PCV) Never done   DEXA scan (bone density measurement)  Never done   Medicare Annual Wellness Visit  01/06/2024   Flu Shot  Completed   HPV Vaccine  Aged Out    Advanced directives: (In Chart) A copy of your advanced directives are scanned into your chart should your provider ever need it.  Next Medicare Annual Wellness Visit scheduled for next year: Yes  Insert Preventive Care attachment Insert FALL PREVENTION attachment if needed

## 2023-01-06 NOTE — Progress Notes (Signed)
Subjective:   Kristi Wiley is a 82 y.o. female who presents for Medicare Annual (Subsequent) preventive examination.  Visit Complete: In person  Patient Medicare AWV questionnaire was completed by the patient on 01/03/2023; I have confirmed that all information answered by patient is correct and no changes since this date.  Cardiac Risk Factors include: advanced age (>41men, >67 women);hypertension     Objective:    Today's Vitals   01/06/23 1259  BP: 122/60  Pulse: (!) 59  Temp: 98.9 F (37.2 C)  TempSrc: Oral  SpO2: 95%  Weight: 154 lb 9.6 oz (70.1 kg)  Height: 5' 5.5" (1.664 m)   Body mass index is 25.34 kg/m.     01/06/2023    1:09 PM 09/06/2019   11:00 AM 08/27/2019    2:27 PM 10/06/2017   12:13 PM 10/02/2017    2:30 PM  Advanced Directives  Does Patient Have a Medical Advance Directive? Yes No No No No  Type of Estate agent of [place];Living will      Copy of Healthcare Power of Attorney in Chart? Yes - validated most recent copy scanned in chart ([place] information)      Would patient like information on creating a medical advance directive?  Yes (MAU/Ambulatory/Procedural Areas - Information given) Yes (MAU/Ambulatory/Procedural Areas - Information given) No - Patient declined No - Patient declined    Current Medications (verified) Outpatient Encounter Medications as of 01/06/2023  Medication Sig   amLODipine (NORVASC) 2.5 MG tablet Take 2.5 mg by mouth daily.   Apoaequorin (PREVAGEN) 10 MG CAPS    aspirin EC 81 MG tablet Take 81 mg by mouth daily. Swallow whole.   Biotin 16109 MCG TABS Take 10,000 mcg by mouth daily after supper.    cholecalciferol (VITAMIN D) 1000 units tablet Take 2,000 Units by mouth daily.   FIBER PO Take 4.2 g by mouth daily. Metamucil   labetalol (NORMODYNE) 200 MG tablet Take 200 mg by mouth 2 (two) times daily.   losartan-hydrochlorothiazide (HYZAAR) 100-12.5 MG tablet Take 1 tablet by mouth daily at 12 noon. Mid  day   Multiple Vitamin (MULTIVITAMIN WITH MINERALS) TABS tablet Take 1 tablet by mouth daily. Centrum Silver for Women   Omega-3 Fatty Acids (FISH OIL) 1000 MG CAPS Take 1,000 mg by mouth daily with supper.    OVER THE COUNTER MEDICATION Take 1 tablet by mouth 2 (two) times daily. Viviscal [facility]   pravastatin (PRAVACHOL) 40 MG tablet Take 1 tablet (40 mg total) by mouth at bedtime.   psyllium (METAMUCIL) 58.6 % packet Take by mouth daily.   No facility-administered encounter medications on file as of 01/06/2023.    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   Past Surgical History:  Procedure Laterality Date   ABDOMINAL HYSTERECTOMY  1986   BREAST SURGERY Right 2000  reconstructive     CATARACT EXTRACTION, BILATERAL  2015   eye cosmetic  Bilateral    [place]    MOHS SURGERY  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    TONSILLECTOMY     TOTAL HIP ARTHROPLASTY Right 10/06/2017   Procedure: RIGHT TOTAL HIP ARTHROPLASTY ANTERIOR APPROACH;  Surgeon: Kathryne Hitch, MD;  Location: [facility];  Service: Orthopedics;  Laterality: Right;   TOTAL HIP ARTHROPLASTY Left 09/06/2019   Procedure: LEFT TOTAL HIP ARTHROPLASTY ANTERIOR APPROACH;  Surgeon: Kathryne Hitch, MD;  Location: [facility];  Service: Orthopedics;  Laterality: Left;   TOTAL MASTECTOMY Right 01/24/1996   Family History  Problem Relation Age of  Onset   CAD Father    Social History   Socioeconomic History   Marital status: Widowed    Spouse name: Not on file   Number of children: Not on file   Years of education: Not on file   Highest education level: Associate degree: academic program  Occupational History   Not on file  Tobacco Use   Smoking status: Never   Smokeless tobacco: Never  Vaping Use   Vaping status: Never Used  Substance and Sexual Activity   Alcohol use: Yes    Comment: rare , wine     Drug use: Never   Sexual activity: Not on file  Other Topics Concern   Not on file  Social History Narrative   Not on file   Social Determinants of Health   Financial Resource Strain: Low Risk  (01/03/2023)   Overall Financial Resource Strain (CARDIA)    Difficulty of Paying Living Expenses: Not hard at all  Food Insecurity: No Food Insecurity (01/03/2023)   Hunger Vital Sign    Worried About Running Out of Food in the Last Year: Never true    Ran Out of Food in the Last Year: Never true  Transportation Needs: No Transportation Needs (01/03/2023)   PRAPARE - Administrator, Civil Service (Medical): No    Lack of Transportation (Non-Medical): No  Physical Activity: Sufficiently Active (01/03/2023)   Exercise Vital Sign    Days of Exercise per Week: 3 days    Minutes of Exercise per Session: 50 min  Stress: No Stress Concern Present (01/03/2023)   [employer] of Occupational Health - Occupational Stress Questionnaire    Feeling of Stress : Only a little  Social Connections: Moderately Integrated (01/03/2023)   Social Connection and Isolation Panel [NHANES]    Frequency of Communication with Friends and Family: More than three times a week    Frequency of Social Gatherings with Friends and Family: More than three times a week    Attends Religious Services: More than 4 times per year    Active Member of [employer] or Organizations: Yes    Attends Banker Meetings: More than 4 times per year     Marital Status: Widowed    Tobacco Counseling Counseling given: Not Answered   Clinical Intake:  Pre-visit preparation completed: Yes  Pain : No/denies pain     Nutritional Status: BMI 25 -29 Overweight Nutritional Risks: None Diabetes: No  How often do you need to have someone help you when you read instructions, pamphlets, or other written materials from your doctor or pharmacy?: 1 - Never  Interpreter Needed?: No  Information entered by :: NAllen LPN   Activities of Daily Living    01/03/2023    2:05 PM  In your present state of health, do  you have any difficulty performing the following activities:  Hearing? 1  Comment has hearing aids, tinnitus  Vision? 0  Difficulty concentrating or making decisions? 1  Walking or climbing stairs? 0  Dressing or bathing? 0  Doing errands, shopping? 0  Preparing Food and eating ? N  Using the Toilet? N  In the past six months, have you accidently leaked urine? Y  Comment wears a pad  Do you have problems with loss of bowel control? N  Managing your Medications? N  Managing your Finances? N  Housekeeping or managing your Housekeeping? N    Patient Care Team: Mliss Sax, MD as PCP - General (Family Medicine)  Indicate any recent Medical Services you may have received from other than [facility] providers in the past year (date may be approximate).     Assessment:   This is a routine wellness examination for Indiyah.  Hearing/Vision screen Hearing Screening - Comments:: Has hearing aids Vision Screening - Comments:: Regular eye exams, [facility]   Goals Addressed             This Visit's Progress    Patient Stated       01/06/2023, keep memory       Depression Screen    01/06/2023    1:11 PM 01/05/2023    1:35 PM 11/11/2022    1:28 PM  PHQ 2/9 Scores  PHQ - 2 Score 0 0 0    Fall Risk    01/05/2023    1:35 PM 01/03/2023    2:05 PM 11/11/2022    1:27 PM  Fall Risk   Falls in the past  year? 0 0 0  Number falls in past yr: 0 0 0  Injury with Fall? 0 0 0  Risk for fall due to : No Fall Risks Medication side effect No Fall Risks  Follow up Falls evaluation completed Falls prevention discussed;Falls evaluation completed Falls prevention discussed    MEDICARE RISK AT HOME: Medicare Risk at Home Any stairs in or around the home?: Yes If so, are there any without handrails?: No Home free of loose throw rugs in walkways, pet beds, electrical cords, etc?: No Adequate lighting in your home to reduce risk of falls?: Yes Life alert?: No Use of a cane, walker or w/c?: No Grab bars in the bathroom?: No Shower chair or bench in shower?: Yes Elevated toilet seat or a handicapped toilet?: Yes  TIMED UP AND GO:  Was the test performed?  Yes  Length of time to ambulate 10 feet: 5 sec Gait steady and fast without use of assistive device    Cognitive Function:        01/06/2023    1:11 PM  6CIT Screen  What Year? 0 points  What month? 0 points  What time? 0 points  Count back from 20 0 points  Months in reverse 0 points  Repeat phrase 0 points  Total Score 0 points    Immunizations  There is no immunization history on file for this patient.  TDAP status: Due, Education has been provided regarding the importance of this vaccine. Advised may receive this vaccine at local pharmacy or Health Dept. Aware to provide a copy of the vaccination record if obtained from local pharmacy or Health Dept. Verbalized acceptance and understanding.  Flu Vaccine status: Up to date  Pneumococcal vaccine status: Due, Education has been provided regarding the importance of this vaccine. Advised may receive this vaccine at local pharmacy or  Health Dept. Aware to provide a copy of the vaccination record if obtained from local pharmacy or Health Dept. Verbalized acceptance and understanding.  Covid-19 vaccine status: Declined, Education has been provided regarding the importance of this  vaccine but patient still declined. Advised may receive this vaccine at local pharmacy or Health Dept.or vaccine clinic. Aware to provide a copy of the vaccination record if obtained from local pharmacy or Health Dept. Verbalized acceptance and understanding.  Qualifies for Shingles Vaccine? Yes   Zostavax completed No   Shingrix Completed?: No.    Education has been provided regarding the importance of this vaccine. Patient has been advised to call insurance company to determine out of pocket expense if they have not yet received this vaccine. Advised may also receive vaccine at local pharmacy or Health Dept. Verbalized acceptance and understanding.  Screening Tests Health Maintenance  Topic Date Due   COVID-19 Vaccine (1) Never done   DTaP/Tdap/Td (1 - Tdap) Never done   Zoster Vaccines- Shingrix (1 of 2) Never done   Pneumonia Vaccine 12+ Years old (1 of 1 - PCV) Never done   DEXA SCAN  Never done   Medicare Annual Wellness (AWV)  01/06/2024   INFLUENZA VACCINE  Completed   HPV VACCINES  Aged Out    Health Maintenance  Health Maintenance Due  Topic Date Due   COVID-19 Vaccine (1) Never done   DTaP/Tdap/Td (1 - Tdap) Never done   Zoster Vaccines- Shingrix (1 of 2) Never done   Pneumonia Vaccine 78+ Years old (1 of 1 - PCV) Never done   DEXA SCAN  Never done    Colorectal cancer screening: No longer required.   Mammogram status: Completed 09/27/2022. Repeat every year  Bone Density status: Completed 2022.   Lung Cancer Screening: (Low Dose CT Chest recommended if Age 80-80 years, 20 pack-year currently smoking OR have quit w/in 15years.) does not qualify.   Lung Cancer Screening Referral: no  Additional Screening:  Hepatitis C Screening: does not qualify;   Vision Screening: Recommended annual ophthalmology exams for early detection of glaucoma and other disorders of the eye. Is the patient up to date with their annual eye exam?  Yes  Who is the provider or what is the  name of the office in which the patient attends annual eye exams? [facility] If pt is not established with a provider, would they like to be referred to a provider to establish care? No .   Dental Screening: Recommended annual dental exams for proper oral hygiene  Diabetic Foot Exam: n/a  Community Resource Referral / Chronic Care Management: CRR required this visit?  No   CCM required this visit?  No     Plan:     I have personally reviewed and noted the following in the patient's chart:   Medical and social history Use of alcohol, tobacco or illicit drugs  Current medications and supplements including opioid prescriptions. Patient is not currently taking opioid prescriptions. Functional ability and status Nutritional status Physical activity Advanced directives List of other physicians Hospitalizations, surgeries, and ER visits in previous 12 months Vitals Screenings to include cognitive, depression, and falls Referrals and appointments  In addition, I have reviewed and discussed with patient certain preventive protocols, quality metrics, and best practice recommendations. A written personalized care plan for preventive services as well as general preventive health recommendations were provided to patient.     Barb Merino, LPN   73/22/[id number]   After Visit Summary: (  In Person-Printed) AVS printed and given to the patient  Nurse Notes: none

## 2023-05-17 ENCOUNTER — Ambulatory Visit: Payer: Medicare Other | Admitting: Orthopaedic Surgery

## 2023-05-18 ENCOUNTER — Ambulatory Visit (INDEPENDENT_AMBULATORY_CARE_PROVIDER_SITE_OTHER): Payer: Medicare Other | Admitting: Orthopaedic Surgery

## 2023-05-18 ENCOUNTER — Other Ambulatory Visit (INDEPENDENT_AMBULATORY_CARE_PROVIDER_SITE_OTHER): Payer: Medicare Other

## 2023-05-18 ENCOUNTER — Other Ambulatory Visit: Payer: Self-pay

## 2023-05-18 ENCOUNTER — Other Ambulatory Visit (INDEPENDENT_AMBULATORY_CARE_PROVIDER_SITE_OTHER): Payer: Self-pay

## 2023-05-18 DIAGNOSIS — M79604 Pain in right leg: Secondary | ICD-10-CM

## 2023-05-18 DIAGNOSIS — M7061 Trochanteric bursitis, right hip: Secondary | ICD-10-CM

## 2023-05-18 DIAGNOSIS — M5441 Lumbago with sciatica, right side: Secondary | ICD-10-CM

## 2023-05-18 DIAGNOSIS — Z96643 Presence of artificial hip joint, bilateral: Secondary | ICD-10-CM

## 2023-05-18 DIAGNOSIS — G8929 Other chronic pain: Secondary | ICD-10-CM

## 2023-05-18 MED ORDER — LIDOCAINE HCL 1 % IJ SOLN
3.0000 mL | INTRAMUSCULAR | Status: AC | PRN
Start: 2023-05-18 — End: 2023-05-18
  Administered 2023-05-18: 3 mL

## 2023-05-18 MED ORDER — METHYLPREDNISOLONE ACETATE 40 MG/ML IJ SUSP
40.0000 mg | INTRAMUSCULAR | Status: AC | PRN
Start: 2023-05-18 — End: 2023-05-18
  Administered 2023-05-18: 40 mg via INTRA_ARTICULAR

## 2023-05-18 NOTE — Progress Notes (Signed)
The patient is well-known to me.  We replaced her right hip and 2019 her left hip in 2021.  The hips have done well but ever since the surgery on her right hip she has had right leg pain with numbness and tingling.  She points to her sciatic region on the right side and it does go down to her knee and actually causes pain beyond the knee down to the foot and ankle.  This is on a regular basis.  She denies any groin pain.  She does report pain on the lateral aspect of her right hip and she does like to sleep on her side.  It does wake her up at night.  She denies any change in bowel or bladder function.  She is not a diabetic.  On exam both hips move smoothly and fluidly with no blocks or rotation.  She does have significant pain to palpation over the right hip trochanteric area.  Her right knee exam is normal.  She does have a positive straight leg raise on the right side and there is pain in the L4 and L5 distribution with some numbness and tingling as well.  2 views of the lumbar spine show significant degenerative changes of the lower lumbar spine.  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  An AP pelvis and lateral the right hip shows a well-seated bilateral total hip arthroplasties with no complicating features.  There are no cortical irregularities I can Beazer around the pelvis or the lateral hip.  I did recommend a steroid injection around the right hip trochanteric area but I do feel that she needs an MRI of her lumbar spine given that this has been going on for 6 years now in terms of the pain with numbness and tingling that she is having that is radicular in nature from the sciatic region and down into the L4 and L5 area of her right leg.  She did tolerate the steroid injection well today.  We will set her up for MRI of her lumbar spine and then Towry her back in follow-up once we have the results of the MRI.  She agrees with this treatment plan.    Procedure  Note  Patient: Kristi Wiley             Date of Birth: 1941-02-22           MRN: [id number]             Visit Date: 05/18/2023  Procedures: Visit Diagnoses:  1. Pain in right leg   2. History of bilateral total hip arthroplasty   3. Chronic right-sided low back pain with right-sided sciatica   4. Trochanteric bursitis, right hip     Large Joint Inj: R greater trochanter on 05/18/2023 3:44 PM Indications: pain and diagnostic evaluation Details: 22 G 1.5 in needle, lateral approach  Arthrogram: No  Medications: 3 mL lidocaine 1 %; 40 mg methylPREDNISolone acetate 40 MG/ML Outcome: tolerated well, no immediate complications Procedure, treatment alternatives, risks and benefits explained, specific risks discussed. Consent was given by the patient. Immediately prior to procedure a time out was called to verify the correct patient, procedure, equipment, support staff and site/side marked as required. Patient was prepped and draped in the usual sterile fashion.

## 2023-06-02 ENCOUNTER — Ambulatory Visit
Admission: RE | Admit: 2023-06-02 | Discharge: 2023-06-02 | Disposition: A | Discharge status: Home / Self Care | Payer: Medicare Other | Source: Ambulatory Visit | Attending: Orthopaedic Surgery | Admitting: Orthopaedic Surgery

## 2023-06-02 DIAGNOSIS — M79604 Pain in right leg: Secondary | ICD-10-CM

## 2023-06-08 ENCOUNTER — Ambulatory Visit: Payer: Medicare Other | Admitting: Physician Assistant

## 2023-06-21 ENCOUNTER — Ambulatory Visit (INDEPENDENT_AMBULATORY_CARE_PROVIDER_SITE_OTHER): Payer: Medicare Other | Admitting: Orthopaedic Surgery

## 2023-06-21 ENCOUNTER — Encounter: Payer: Self-pay | Admitting: Orthopaedic Surgery

## 2023-06-21 ENCOUNTER — Other Ambulatory Visit: Payer: Self-pay

## 2023-06-21 DIAGNOSIS — G8929 Other chronic pain: Secondary | ICD-10-CM

## 2023-06-21 DIAGNOSIS — M5441 Lumbago with sciatica, right side: Secondary | ICD-10-CM | POA: Diagnosis not present

## 2023-06-21 DIAGNOSIS — M7061 Trochanteric bursitis, right hip: Secondary | ICD-10-CM

## 2023-06-21 DIAGNOSIS — M48062 Spinal stenosis, lumbar region with neurogenic claudication: Secondary | ICD-10-CM | POA: Diagnosis not present

## 2023-06-21 DIAGNOSIS — Z96643 Presence of artificial hip joint, bilateral: Secondary | ICD-10-CM

## 2023-06-21 NOTE — Progress Notes (Signed)
 The patient comes in today to go over MRI of her lumbar spine.  We have replaced both of her hips.  She still has some chronic tendinitis and bursitis over the right hip that she is having such significant radicular symptoms going all the way down the right leg that MRI was warranted at the very conservative treatment.  Her plain films of her lumbar spine showed significant spondylolisthesis at L4-L5.  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  She does have a friend with her today and they talked about different modalities of treatment including chiropractic treatment.  She is able to ambulate without any assistive device.  On exam her legs do not feel weak.  She does have numbness in the L4 and L5 distribution.  She does have pain over the tip of the trochanteric area on the right side but her hips move smoothly and fluidly.  At this point I would like to send her to Dr. Alvester Morin for right-sided L4-L5 ESI.  I would also like to send her to outpatient physical therapy at [facility] for any modalities that can help with her spinal stenosis but also with her hip tendinitis and bursitis over the tip of the right hip trochanteric area.  She agrees with this plan.  Will Dibbern her back in 6 weeks to [place] how she is doing overall.

## 2023-06-29 ENCOUNTER — Ambulatory Visit: Admitting: Physical Medicine and Rehabilitation

## 2023-06-29 ENCOUNTER — Other Ambulatory Visit: Payer: Self-pay

## 2023-06-29 VITALS — BP 180/69 | HR 57

## 2023-06-29 DIAGNOSIS — M5416 Radiculopathy, lumbar region: Secondary | ICD-10-CM

## 2023-06-29 DIAGNOSIS — M48061 Spinal stenosis, lumbar region without neurogenic claudication: Secondary | ICD-10-CM

## 2023-06-29 DIAGNOSIS — M48062 Spinal stenosis, lumbar region with neurogenic claudication: Secondary | ICD-10-CM

## 2023-06-29 MED ORDER — METHYLPREDNISOLONE ACETATE 40 MG/ML IJ SUSP
40.0000 mg | Freq: Once | INTRAMUSCULAR | Status: AC
Start: 2023-06-29 — End: 2023-06-29
  Administered 2023-06-29: 40 mg

## 2023-06-29 NOTE — Patient Instructions (Signed)

## 2023-06-29 NOTE — Procedures (Signed)
 Lumbosacral Transforaminal Epidural Steroid Injection - Infraneural Approach with Fluoroscopic Guidance  Patient: Kristi Wiley      Date of Birth: 04/22/1940 MRN: [id number] PCP: Mliss Sax, MD      Visit Date: 06/29/2023   Universal Protocol:     Consent Given By: the patient  Position: PRONE   Additional Comments: Vital signs were monitored before and after the procedure. Patient was prepped and draped in the usual sterile fashion. The correct patient, procedure, and site was verified.   Injection Procedure Details:  Procedure Site One Meds Administered:  Meds ordered this encounter  Medications   methylPREDNISolone acetate (DEPO-MEDROL) injection 40 mg      Laterality: Right  Location/Site:  L4  Needle size: 22 G  Needle type: Spinal  Needle Placement: Transforaminal  Findings:   -Comments: Excellent flow of contrast along the nerve, nerve root and into the epidural space.  Procedure Details: After squaring off the end-plates of the desired vertebral level to get a true AP view, the C-arm was obliqued to the painful side so that the superior articulating process is positioned about 1/3 the length of the inferior endplate.  The needle was aimed toward the junction of the superior articular process and the transverse process of the inferior vertebrae. The needle's initial entry is in the lower third of the foramen through Kambin's triangle. The soft tissues overlying this target were infiltrated with 2-3 ml. of 1% Lidocaine without Epinephrine.  The spinal needle was then inserted and advanced toward the target using a "trajectory" view along the fluoroscope beam.  Under AP and lateral visualization, the needle was advanced so it did not puncture dura and did not traverse medially beyond the 6 o'clock position of the pedicle. Bi-planar projections were used to confirm position. Aspiration was confirmed to be negative for CSF and/or blood. A 1-2 ml. volume of  Isovue-250 was injected and flow of contrast was noted at each level. Radiographs were obtained for documentation purposes.   After attaining the desired flow of contrast documented above, a 0.5 to 1.0 ml test dose of 0.25% Marcaine was injected into each respective transforaminal space.  The patient was observed for 90 seconds post injection.  After no sensory deficits were reported, and normal lower extremity motor function was noted,   the above injectate was administered so that equal amounts of the injectate were placed at each foramen (level) into the transforaminal epidural space.   Additional Comments:  The patient tolerated the procedure well Dressing: Band-Aid    Post-procedure details: Patient was observed during the procedure. Post-procedure instructions were reviewed.  Patient left the clinic in stable condition.

## 2023-06-29 NOTE — Progress Notes (Signed)
 Pain Scale   Average Pain 4        +Driver, -BT, -Dye Allergies.

## 2023-07-05 NOTE — Progress Notes (Signed)
 Kristi Wiley - 83 y.o. female MRN [id number]  Date of birth: [birth year]  Office Visit Note: Visit Date: 06/29/2023 PCP: Mliss Sax, MD Referred by: Mliss Sax,*  Subjective: Chief Complaint  Patient presents with   Lower Back - Pain   HPI:  Kristi Wiley is a 83 y.o. female who comes in today at the request of Dr. Doneen Poisson for planned Right L4-5 Lumbar Transforaminal epidural steroid injection with fluoroscopic guidance.  The patient has failed conservative care including home exercise, medications, time and activity modification.  This injection will be diagnostic and hopefully therapeutic.  Please Zellman requesting physician notes for further details and justification.   ROS Otherwise per HPI.  Assessment & Plan: Visit Diagnoses:    ICD-10-CM   1. Lumbar radiculopathy  M54.16 XR C-ARM NO REPORT    Epidural Steroid injection    methylPREDNISolone acetate (DEPO-MEDROL) injection 40 mg    2. Spinal stenosis of lumbar region with neurogenic claudication  M48.062     3. Foraminal stenosis of lumbar region  M48.061       Plan: No additional findings.   Meds & Orders:  Meds ordered this encounter  Medications   methylPREDNISolone acetate (DEPO-MEDROL) injection 40 mg    Orders Placed This Encounter  Procedures   XR C-ARM NO REPORT   Epidural Steroid injection    Follow-up: Return for visit to requesting provider as needed.   Procedures: No procedures performed  Lumbosacral Transforaminal Epidural Steroid Injection - Infraneural Approach with Fluoroscopic Guidance  Patient: Kristi Wiley      Date of Birth: 12-15-1940 MRN: [id number] PCP: Mliss Sax, MD      Visit Date: 06/29/2023   Universal Protocol:     Consent Given By: the patient  Position: PRONE   Additional Comments: Vital signs were monitored before and after the procedure. Patient was prepped and draped in the usual sterile fashion. The correct patient,  procedure, and site was verified.   Injection Procedure Details:  Procedure Site One Meds Administered:  Meds ordered this encounter  Medications   methylPREDNISolone acetate (DEPO-MEDROL) injection 40 mg      Laterality: Right  Location/Site:  L4  Needle size: 22 G  Needle type: Spinal  Needle Placement: Transforaminal  Findings:   -Comments: Excellent flow of contrast along the nerve, nerve root and into the epidural space.  Procedure Details: After squaring off the end-plates of the desired vertebral level to get a true AP view, the C-arm was obliqued to the painful side so that the superior articulating process is positioned about 1/3 the length of the inferior endplate.  The needle was aimed toward the junction of the superior articular process and the transverse process of the inferior vertebrae. The needle's initial entry is in the lower third of the foramen through Kambin's triangle. The soft tissues overlying this target were infiltrated with 2-3 ml. of 1% Lidocaine without Epinephrine.  The spinal needle was then inserted and advanced toward the target using a "trajectory" view along the fluoroscope beam.  Under AP and lateral visualization, the needle was advanced so it did not puncture dura and did not traverse medially beyond the 6 o'clock position of the pedicle. Bi-planar projections were used to confirm position. Aspiration was confirmed to be negative for CSF and/or blood. A 1-2 ml. volume of Isovue-250 was injected and flow of contrast was noted at each level. Radiographs were obtained for documentation purposes.   After attaining the desired  flow of contrast documented above, a 0.5 to 1.0 ml test dose of 0.25% Marcaine was injected into each respective transforaminal space.  The patient was observed for 90 seconds post injection.  After no sensory deficits were reported, and normal lower extremity motor function was noted,   the above injectate was administered so  that equal amounts of the injectate were placed at each foramen (level) into the transforaminal epidural space.   Additional Comments:  The patient tolerated the procedure well Dressing: Band-Aid    Post-procedure details: Patient was observed during the procedure. Post-procedure instructions were reviewed.  Patient left the clinic in stable condition.   Clinical History: Narrative & Impression CLINICAL DATA:  Chronic low back and bilateral hip/leg pain.   EXAM: MRI LUMBAR SPINE WITHOUT CONTRAST   TECHNIQUE: Multiplanar, multisequence MR imaging of the lumbar spine was performed. No intravenous contrast was administered.   COMPARISON:  Lumbar spine radiographs 05/18/2023   FINDINGS: Segmentation:  Standard.   Alignment:  Grade 1 anterolisthesis of L3 on L4 and L4 on L5.   Vertebrae: No fracture, suspicious marrow lesion, or substantial marrow edema.   Conus medullaris and cauda equina: Conus extends to the L1 level. Conus and cauda equina appear normal.   Paraspinal and other soft tissues: Unremarkable.   Disc levels:   Disc desiccation throughout the lumbar spine. Mild disc space narrowing at L3-4 and L4-5. Partial interbody ankylosis at L5-S1.   T12-L1: Mild disc bulging and mild facet hypertrophy without stenosis.   L1-2: Mild disc bulging and moderate facet and ligamentum flavum hypertrophy without stenosis.   L2-3: Disc bulging and severe facet and ligamentum flavum hypertrophy result in mild-to-moderate spinal stenosis without neural foraminal stenosis.   L3-4: Anterolisthesis with bulging of uncovered disc and severe facet and ligamentum flavum hypertrophy result in moderate spinal stenosis and mild bilateral neural foraminal stenosis.   L4-5: Anterolisthesis with bulging of uncovered disc, a right subarticular to right foraminal disc extrusion with mild superior migration, and severe facet and ligamentum flavum hypertrophy result in severe spinal  stenosis and severe right and moderate left medial neural foraminal stenosis. Potential bilateral L4 and L5 nerve root impingement. Small right facet joint effusion.   L5-S1: Partial interbody fusion. Endplate spurring and moderate facet hypertrophy without significant stenosis.   IMPRESSION: 1. Advanced lumbar facet arthrosis with grade 1 anterolisthesis of L3 on L4 and L4 on L5. 2. Severe spinal stenosis and severe right and moderate left neural foraminal stenosis at L4-5. 3. Moderate spinal stenosis at L3-4. 4. Mild-to-moderate spinal stenosis at L2-3.     Electronically Signed   By: Sebastian Ache M.D.   On: 06/20/2023 16:56     Objective:  VS:  HT:    WT:   BMI:     BP:(!) 180/69  HR:(!) 57bpm  TEMP: ( )  RESP:  Physical Exam Vitals and nursing note reviewed.  Constitutional:      General: She is not in acute distress.    Appearance: Normal appearance. She is not ill-appearing.  HENT:     Head: Normocephalic and atraumatic.     Right Ear: External ear normal.     Left Ear: External ear normal.  Eyes:     Extraocular Movements: Extraocular movements intact.  Cardiovascular:     Rate and Rhythm: Normal rate.     Pulses: Normal pulses.  Pulmonary:     Effort: Pulmonary effort is normal. No respiratory distress.  Abdominal:     General: There is  no distension.     Palpations: Abdomen is soft.  Musculoskeletal:        General: Tenderness present.     Cervical back: Neck supple.     Right lower leg: No edema.     Left lower leg: No edema.     Comments: Patient has good distal strength with no pain over the greater trochanters.  No clonus or focal weakness.  Skin:    Findings: No erythema, lesion or rash.  Neurological:     General: No focal deficit present.     Mental Status: She is alert and oriented to person, place, and time.     Sensory: No sensory deficit.     Motor: No weakness or abnormal muscle tone.     Coordination: Coordination normal.   Psychiatric:        Mood and Affect: Mood normal.        Behavior: Behavior normal.      Imaging: No results found.

## 2023-07-18 ENCOUNTER — Ambulatory Visit: Attending: Orthopaedic Surgery

## 2023-07-18 DIAGNOSIS — M25551 Pain in right hip: Secondary | ICD-10-CM | POA: Insufficient documentation

## 2023-07-18 DIAGNOSIS — G8929 Other chronic pain: Secondary | ICD-10-CM | POA: Insufficient documentation

## 2023-07-18 DIAGNOSIS — M5416 Radiculopathy, lumbar region: Secondary | ICD-10-CM | POA: Diagnosis present

## 2023-07-18 DIAGNOSIS — M5441 Lumbago with sciatica, right side: Secondary | ICD-10-CM | POA: Diagnosis not present

## 2023-07-18 NOTE — Therapy (Signed)
 OUTPATIENT PHYSICAL THERAPY THORACOLUMBAR EVALUATION   Patient Name: Kristi Wiley MRN: [id number] DOB:27-Jun-1940, 83 y.o., female Today's Date: 07/19/2023  END OF SESSION:  PT End of Session - 07/18/23 1314     Visit Number 1    Date for PT Re-Evaluation 09/12/23    Progress Note Due on Visit 10    PT Start Time 1315    PT Stop Time 1400    PT Time Calculation (min) 45 min    Activity Tolerance Patient tolerated treatment well    Behavior During Therapy [facility] for tasks assessed/performed             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   Past Surgical History:  Procedure Laterality Date   ABDOMINAL HYSTERECTOMY  1986   BREAST SURGERY Right 2000   reconstructive     CATARACT EXTRACTION, BILATERAL  2015   eye cosmetic  Bilateral    [place]    MOHS SURGERY  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    TONSILLECTOMY     TOTAL HIP ARTHROPLASTY Right 10/06/2017   Procedure: RIGHT TOTAL HIP ARTHROPLASTY ANTERIOR APPROACH;  Surgeon: Arnie Lao, MD;  Location: [facility];  Service: Orthopedics;  Laterality: Right;   TOTAL HIP ARTHROPLASTY Left 09/06/2019   Procedure: LEFT TOTAL HIP ARTHROPLASTY ANTERIOR APPROACH;  Surgeon: Arnie Lao, MD;  Location: [facility];  Service: Orthopedics;  Laterality: Left;   TOTAL MASTECTOMY Right 01/24/1996   Patient Active Problem List   Diagnosis Date Noted   Stage 3 chronic kidney disease (HCC) 01/05/2023   Essential hypertension 11/11/2022    Elevated LDL cholesterol level 11/11/2022   Elevated TSH 11/11/2022   Dysfunction of both eustachian tubes 11/11/2022   Status post total replacement of left hip 09/06/2019   History of right hip replacement 05/17/2018   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   Pain of right hip joint 06/19/2017   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    PCP: Christianna Cowman, MD  REFERRING PROVIDER: Concetta Dee, MD  REFERRING DIAG: chronic R sciatic pain R   Rationale for Evaluation and Treatment: Rehabilitation  THERAPY DIAG:  Pain in right hip  Radiculopathy, lumbar region  ONSET DATE: chronic  SUBJECTIVE:  SUBJECTIVE STATEMENT: Y 3 Days a week,TM 10 MIN, Utilizing isotonic equipment, abdominals, lumbar ext, leg press, knee ext weighted rows  PERTINENT HISTORY:  Chronic lower back pain, very active, h/o hip replacements B ant approach, within the last 5 years. Very active, developed R lat/post hip pain and saw her orthopedist, who referred her to PT. Also has undergone injection R L4/5 to address this pain, about 3 weeks ago.    PAIN:  Are you having pain? Yes: NPRS scale: 0 to4 Pain location: R lat hip and post hip, gluteals Pain description: pain with standing too long and can't lie on R side Aggravating factors: [place] above Relieving factors: the shot sems to have helped some  PRECAUTIONS: None  RED FLAGS: None   WEIGHT BEARING RESTRICTIONS: No  FALLS:  Has patient fallen in last 6 months? No  LIVING ENVIRONMENT: Lives with: lives alone Lives in: House/apartment Stairs:  one step into house from outside  Has following equipment at home: Single point cane  OCCUPATION: retired  PLOF: Independent  PATIENT GOALS: manage pain, be able to  work out without pain lumbar and R post hip  NEXT MD VISIT: 6 weeks  OBJECTIVE:  Note: Objective measures were completed at Evaluation unless otherwise noted.  DIAGNOSTIC FINDINGS:  IMPRESSION: 1. Advanced lumbar facet arthrosis with grade 1 anterolisthesis of L3 on L4 and L4 on L5. 2. Severe spinal stenosis and severe right and moderate left neural foraminal stenosis at L4-5. 3. Moderate spinal stenosis at L3-4. 4. Mild-to-moderate spinal stenosis at L2-3.     Electronically Signed   By: Aundra Lee M.D.   On: 06/20/2023 16:56  PATIENT SURVEYS:  Modified Oswestry Modified Oswestry Low Back Pain Disability Questionnaire: 20 / 50 = 40.0 %   COGNITION: Overall cognitive status: Within functional limits for tasks assessed     SENSATION: WFL with light touch today patient however reports numbness that fluctuates since she has had her R THA, entire R LE at times  MUSCLE LENGTH: Hamstrings: Right wnl deg; Left wnl deg  POSTURE: in standing scoliotic posture, R lumbar concavity, L thoracic concavity, R iliac crest elevated, keeps R knee partially flexed  PALPATION: Some tenderness R piriformis region, along R lat sacral border  LUMBAR ROM:   AROM eval  Flexion P! 60%  Extension P! 20 degrees  Right lateral flexion P! Hand to 2" above knee  Left lateral flexion Hand 2 below knee no pain  Right rotation   Left rotation    (Blank rows = not tested)  LOWER EXTREMITY ROM:     AAROM   Right eval Left eval  Hip flexion 98 with p!   Hip extension    Hip abduction    Hip adduction    Hip internal rotation P! 10 degrees   Hip external rotation 20 degrees p!   Knee flexion    Knee extension    Ankle dorsiflexion    Ankle plantarflexion    Ankle inversion    Ankle eversion     (Blank rows = not tested)  LOWER EXTREMITY MMT:  all MMT wnl today LE's, able to heel and toe walk B without problems  LUMBAR SPECIAL TESTS:  Slump test: Negative and FABER test:  Positive  FUNCTIONAL TESTS:  30 seconds chair stand test TBD  GAIT: Distance walked: in clinic 75' at a time, no significant deficits noted TREATMENT DATE: 07/18/23:  Evaluation:  Side lying L for thoracic lumbar side bending, flexion stretch , with thoracic  open books, inst to utilize for pain relief  Added 1/8" cork insert L shoe to better align iliac crest height and reduce lumbar scoliosis, able to side bend R improved Rom with less pain.  Advised to trial for the next few days for pain management, to relieve her Sx.                                                                                                                                  PATIENT EDUCATION:  Education details: POC, goals Person educated: Patient Education method: Explanation, Demonstration, Tactile cues, Verbal cues, and Handouts Education comprehension: verbalized understanding, returned demonstration, and verbal cues required  HOME EXERCISE PROGRAM: TBD  ASSESSMENT:  CLINICAL IMPRESSION: Patient is a 83 y.o. female who was evaluated today by physical therapy due to R sided lat/ post hip pain, recently worsened.the most notable findings with evaluation are postural changes with scoliotic changes lumbar region as well as limitations in lumbar ROM as well as R hip ROM.  No strength deficits and some localized soreness/ tenderness R piriformis region.  She should benefit from skilled PT to address her deficits and progress her with appropriate stretching, activity modifications to address her symptoms and assist her with long term management.     OBJECTIVE IMPAIRMENTS: difficulty walking, decreased ROM, impaired flexibility, prosthetic dependency , and pain.   ACTIVITY LIMITATIONS: bending, sitting, transfers, and locomotion level  PARTICIPATION LIMITATIONS: community activity  PERSONAL FACTORS: Age, Behavior pattern, Past/current experiences, Time since onset of injury/illness/exacerbation, and 1-2  comorbidities: B THA  are also affecting patient's functional outcome.   REHAB POTENTIAL: Good  CLINICAL DECISION MAKING: Evolving/moderate complexity  EVALUATION COMPLEXITY: Moderate   GOALS: Goals reviewed with patient? Yes  SHORT TERM GOALS: Target date: 2 weeks, 08/01/23  I HEP  Baseline: TBD , initiated one stretch today Goal status: INITIAL   LONG TERM GOALS: Target date: 8 weeks, 09/12/23  Improve R hip ROM for flex , ER to wfl without pain Baseline: pain with R hip flex 92, ER  Goal status: INITIAL  2.  Imrpove lumbar flexibility for R SB, flexion, ext to wnl without pain R lumbar/post hip Baseline: pain end range and reduced ROM Tagle chart above Goal status: INITIAL  3.  Modified oswestry improve from 20/50 or 40% to 10/50  Baseline:  Goal status: INITIAL  4.  Pt will be able to resume her usual exercise routine at fitness center without increase in Sx lumbar and R hip Baseline: has stopped completely with recent episode Goal status: INITIAL  PLAN:  PT FREQUENCY: 1-2x/week  PT DURATION: 8 weeks  PLANNED INTERVENTIONS: 97110-Therapeutic exercises, 97530- Therapeutic activity, [id number]- Neuromuscular re-education, 97535- Self Care, [id number]- Manual therapy, and 97033- Ionotophoresis 4mg /ml Dexamethasone .  PLAN FOR NEXT SESSION: how did she tolerate lift, gentle stretching for R lumbar spine, TPDN? For R gluteal region   Rushi Chasen L Jaiveer Panas, PT, DPT, OCS 07/19/2023, 4:58 PM

## 2023-07-19 ENCOUNTER — Other Ambulatory Visit: Payer: Self-pay

## 2023-07-28 ENCOUNTER — Encounter: Payer: Self-pay | Admitting: Physical Therapy

## 2023-07-28 ENCOUNTER — Ambulatory Visit: Attending: Orthopaedic Surgery | Admitting: Physical Therapy

## 2023-07-28 DIAGNOSIS — M25551 Pain in right hip: Secondary | ICD-10-CM | POA: Insufficient documentation

## 2023-07-28 DIAGNOSIS — M5416 Radiculopathy, lumbar region: Secondary | ICD-10-CM | POA: Diagnosis present

## 2023-07-28 NOTE — Therapy (Signed)
 OUTPATIENT PHYSICAL THERAPY THORACOLUMBAR TREATMENT   Patient Name: Artasia Sajjad Friedt MRN: [id number] DOB:1940-10-02, 83 y.o., female Today's Date: 07/28/2023  END OF SESSION:  PT End of Session - 07/28/23 1017     Visit Number 2    Date for PT Re-Evaluation 09/12/23    PT Start Time 1014    PT Stop Time 1100    PT Time Calculation (min) 46 min    Activity Tolerance Patient tolerated treatment well    Behavior During Therapy [facility] for tasks assessed/performed             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   Past Surgical History:  Procedure Laterality Date   ABDOMINAL HYSTERECTOMY  1986   BREAST SURGERY Right 2000   reconstructive     CATARACT EXTRACTION, BILATERAL  2015   eye cosmetic  Bilateral    [place]    MOHS SURGERY  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    TONSILLECTOMY     TOTAL HIP ARTHROPLASTY Right 10/06/2017   Procedure: RIGHT TOTAL HIP ARTHROPLASTY ANTERIOR APPROACH;  Surgeon: Arnie Lao, MD;  Location: [facility];  Service: Orthopedics;  Laterality: Right;   TOTAL HIP ARTHROPLASTY Left 09/06/2019   Procedure: LEFT TOTAL HIP ARTHROPLASTY ANTERIOR APPROACH;  Surgeon: Arnie Lao, MD;  Location: [facility];  Service: Orthopedics;  Laterality: Left;   TOTAL MASTECTOMY Right 01/24/1996   Patient Active Problem List   Diagnosis Date Noted   Stage 3 chronic kidney disease (HCC) 01/05/2023   Essential hypertension 11/11/2022   Elevated LDL cholesterol level  11/11/2022   Elevated TSH 11/11/2022   Dysfunction of both eustachian tubes 11/11/2022   Status post total replacement of left hip 09/06/2019   History of right hip replacement 05/17/2018   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   Pain of right hip joint 06/19/2017   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    PCP: Christianna Cowman, MD  REFERRING PROVIDER: Concetta Dee, MD  REFERRING DIAG: chronic R sciatic pain R   Rationale for Evaluation and Treatment: Rehabilitation  THERAPY DIAG:  Pain in right hip  Radiculopathy, lumbar region  ONSET DATE: chronic  SUBJECTIVE:  SUBJECTIVE STATEMENT: Reports that she went shopping yesterday for plants and was up for a long period and really was hurting after that.  Reports when she rests she does have less pain  Y 3 Days a week,TM 10 MIN, Utilizing isotonic equipment, abdominals, lumbar ext, leg press, knee ext weighted rows  PERTINENT HISTORY:  Chronic lower back pain, very active, h/o hip replacements B ant approach, within the last 5 years. Very active, developed R lat/post hip pain and saw her orthopedist, who referred her to PT. Also has undergone injection R L4/5 to address this pain, about 3 weeks ago.    PAIN:  Are you having pain? Yes: NPRS scale: 0 to4 Pain location: R lat hip and post hip, gluteals Pain description: pain with standing too long and can't lie on R side Aggravating factors: [place] above Relieving factors: the shot sems to have helped some  PRECAUTIONS: None  RED FLAGS: None   WEIGHT BEARING RESTRICTIONS: No  FALLS:  Has patient fallen in last 6 months? No  LIVING ENVIRONMENT: Lives with: lives alone Lives in: House/apartment Stairs:  one step into house from outside   Has following equipment at home: Single point cane  OCCUPATION: retired  PLOF: Independent  PATIENT GOALS: manage pain, be able to work out without pain lumbar and R post hip  NEXT MD VISIT: 6 weeks  OBJECTIVE:  Note: Objective measures were completed at Evaluation unless otherwise noted.  DIAGNOSTIC FINDINGS:  IMPRESSION: 1. Advanced lumbar facet arthrosis with grade 1 anterolisthesis of L3 on L4 and L4 on L5. 2. Severe spinal stenosis and severe right and moderate left neural foraminal stenosis at L4-5. 3. Moderate spinal stenosis at L3-4. 4. Mild-to-moderate spinal stenosis at L2-3.     Electronically Signed   By: Aundra Lee M.D.   On: 06/20/2023 16:56  PATIENT SURVEYS:  Modified Oswestry Modified Oswestry Low Back Pain Disability Questionnaire: 20 / 50 = 40.0 %   COGNITION: Overall cognitive status: Within functional limits for tasks assessed     SENSATION: WFL with light touch today patient however reports numbness that fluctuates since she has had her R THA, entire R LE at times  MUSCLE LENGTH: Hamstrings: Right wnl deg; Left wnl deg  POSTURE: in standing scoliotic posture, R lumbar concavity, L thoracic concavity, R iliac crest elevated, keeps R knee partially flexed  PALPATION: Some tenderness R piriformis region, along R lat sacral border  LUMBAR ROM:   AROM eval  Flexion P! 60%  Extension P! 20 degrees  Right lateral flexion P! Hand to 2" above knee  Left lateral flexion Hand 2 below knee no pain  Right rotation   Left rotation    (Blank rows = not tested)  LOWER EXTREMITY ROM:     AAROM   Right eval Left eval  Hip flexion 98 with p!   Hip extension    Hip abduction    Hip adduction    Hip internal rotation P! 10 degrees   Hip external rotation 20 degrees p!   Knee flexion    Knee extension    Ankle dorsiflexion    Ankle plantarflexion    Ankle inversion    Ankle eversion     (Blank rows = not tested)  LOWER EXTREMITY MMT:  all  MMT wnl today LE's, able to heel and toe walk B without problems  LUMBAR SPECIAL TESTS:  Slump test: Negative and FABER test: Positive  FUNCTIONAL TESTS:  30 seconds chair stand test TBD  GAIT: Distance walked: in clinic 36' at a time, no significant deficits noted TREATMENT DATE: 07/18/23:  07/28/23 Nustep level 5 x 6 minutes Passive stretch right LE Bridges Clamshells with green tband Bridge with tband clamshells STM to the right buttock and the right quad, adductors Education about DN, possible in the future   Evaluation:  Side lying L for thoracic lumbar side bending, flexion stretch , with thoracic open books, inst to utilize for pain relief  Added 1/8" cork insert L shoe to better align iliac crest height and reduce lumbar scoliosis, able to side bend R improved Rom with less pain.  Advised to trial for the next few days for pain management, to relieve her Sx.                                                                                                                                  PATIENT EDUCATION:  Education details: POC, goals Person educated: Patient Education method: Programmer, multimedia, Demonstration, Tactile cues, Verbal cues, and Handouts Education comprehension: verbalized understanding, returned demonstration, and verbal cues required  HOME EXERCISE PROGRAM: TBD  ASSESSMENT:  CLINICAL IMPRESSION: Patient comes in a little sore, reports that she was out shopping for 4 hours yesterday and feels very tight, I initiated some exercises today and started flexibility and some STM, will need to give her HEP depending on how she responds to today, she is extremely tight in the right piriformis quad and adductor.  Patient is a 83 y.o. female who was evaluated today by physical therapy due to R sided lat/ post hip pain, recently worsened.the most notable findings with evaluation are postural changes with scoliotic changes lumbar region as well as limitations in lumbar ROM  as well as R hip ROM.  No strength deficits and some localized soreness/ tenderness R piriformis region.  She should benefit from skilled PT to address her deficits and progress her with appropriate stretching, activity modifications to address her symptoms and assist her with long term management.     OBJECTIVE IMPAIRMENTS: difficulty walking, decreased ROM, impaired flexibility, prosthetic dependency , and pain.   ACTIVITY LIMITATIONS: bending, sitting, transfers, and locomotion level  PARTICIPATION LIMITATIONS: community activity  PERSONAL FACTORS: Age, Behavior pattern, Past/current experiences, Time since onset of injury/illness/exacerbation, and 1-2 comorbidities: B THA  are also affecting patient's functional outcome.   REHAB POTENTIAL: Good  CLINICAL DECISION MAKING: Evolving/moderate complexity  EVALUATION COMPLEXITY: Moderate   GOALS: Goals reviewed with patient? Yes  SHORT TERM GOALS: Target date: 2 weeks, 08/01/23  I HEP  Baseline: TBD , initiated one stretch today Goal status: INITIAL   LONG TERM GOALS: Target date: 8 weeks, 09/12/23  Improve R hip ROM for flex , ER to wfl without pain Baseline: pain with R hip flex 92, ER  Goal status: INITIAL  2.  Imrpove lumbar flexibility for R SB, flexion, ext to wnl without pain R lumbar/post hip Baseline: pain end range and  reduced ROM Meloy chart above Goal status: INITIAL  3.  Modified oswestry improve from 20/50 or 40% to 10/50  Baseline:  Goal status: INITIAL  4.  Pt will be able to resume her usual exercise routine at fitness center without increase in Sx lumbar and R hip Baseline: has stopped completely with recent episode Goal status: INITIAL  PLAN:  PT FREQUENCY: 1-2x/week  PT DURATION: 8 weeks  PLANNED INTERVENTIONS: 97110-Therapeutic exercises, 97530- Therapeutic activity, [id number]- Neuromuscular re-education, 97535- Self Care, 16109- Manual therapy, and 97033- Ionotophoresis 4mg /ml Dexamethasone .  PLAN FOR  NEXT SESSION: Assess response to first treatment, give HEP, DN, she did say that the insert broke   Darius Lundberg W, PT, 07/28/2023, 10:17 AM

## 2023-08-01 ENCOUNTER — Ambulatory Visit

## 2023-08-01 DIAGNOSIS — M25551 Pain in right hip: Secondary | ICD-10-CM

## 2023-08-01 DIAGNOSIS — M5416 Radiculopathy, lumbar region: Secondary | ICD-10-CM

## 2023-08-01 NOTE — Therapy (Signed)
 OUTPATIENT PHYSICAL THERAPY THORACOLUMBAR TREATMENT   Patient Name: Janila Sylla Easterday MRN: [id number] DOB:1941-02-20, 83 y.o., female Today's Date: 08/01/2023  END OF SESSION:    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   Past Surgical History:  Procedure Laterality Date   ABDOMINAL HYSTERECTOMY  1986   BREAST SURGERY Right 2000   reconstructive     CATARACT EXTRACTION, BILATERAL  2015   eye cosmetic  Bilateral    [place]    MOHS SURGERY  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    TONSILLECTOMY     TOTAL HIP ARTHROPLASTY Right 10/06/2017   Procedure: RIGHT TOTAL HIP ARTHROPLASTY ANTERIOR APPROACH;  Surgeon: Arnie Lao, MD;  Location: [facility];  Service: Orthopedics;  Laterality: Right;   TOTAL HIP ARTHROPLASTY Left 09/06/2019   Procedure: LEFT TOTAL HIP ARTHROPLASTY ANTERIOR APPROACH;  Surgeon: Arnie Lao, MD;  Location: [facility];  Service: Orthopedics;  Laterality: Left;   TOTAL MASTECTOMY Right 01/24/1996   Patient Active Problem List   Diagnosis Date Noted   Stage 3 chronic kidney disease (HCC) 01/05/2023   Essential hypertension 11/11/2022   Elevated LDL cholesterol level 11/11/2022   Elevated TSH 11/11/2022   Dysfunction of both eustachian tubes 11/11/2022   Status post total replacement of left hip 09/06/2019   History of right hip replacement 05/17/2018   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   Pain of right hip joint 06/19/2017   Chronic bilateral low back  pain with right-sided sciatica 06/19/2017    PCP: Christianna Cowman, MD  REFERRING PROVIDER: Concetta Dee, MD  REFERRING DIAG: chronic R sciatic pain R   Rationale for Evaluation and Treatment: Rehabilitation  THERAPY DIAG:  Pain in right hip  Radiculopathy, lumbar region  ONSET DATE: chronic  SUBJECTIVE:                                                                                                                                                                                           SUBJECTIVE STATEMENT: Pt reports her pain not too bad,  it's mostly her R hip but she know's it comes from her back.   Y 3 Days a week,TM 10 MIN, Utilizing isotonic equipment, abdominals, lumbar ext, leg press, knee ext weighted rows  PERTINENT HISTORY:  Chronic lower back pain, very active, h/o hip replacements B ant approach, within the last 5 years. Very active, developed R lat/post hip pain and saw her orthopedist, who referred her to PT. Also has undergone injection R L4/5 to address this pain, about 3 weeks ago.    PAIN:  Are you having pain? Yes: NPRS scale: 0 to4 Pain location: R lat hip and post hip, gluteals Pain description: pain with standing too long and can't lie on R side Aggravating factors: [place] above Relieving factors: the shot sems to have helped some  PRECAUTIONS: None  RED FLAGS: None   WEIGHT BEARING RESTRICTIONS: No  FALLS:  Has patient fallen in last 6 months? No  LIVING ENVIRONMENT: Lives with: lives alone Lives in: House/apartment Stairs:  one step into house from outside  Has following equipment at home: Single point cane  OCCUPATION: retired  PLOF: Independent  PATIENT GOALS: manage pain, be able to work out without pain lumbar and R post hip  NEXT MD VISIT: 6 weeks  OBJECTIVE:  Note: Objective measures were completed at Evaluation unless otherwise noted.  DIAGNOSTIC FINDINGS:  IMPRESSION: 1. Advanced lumbar facet arthrosis with grade 1  anterolisthesis of L3 on L4 and L4 on L5. 2. Severe spinal stenosis and severe right and moderate left neural foraminal stenosis at L4-5. 3. Moderate spinal stenosis at L3-4. 4. Mild-to-moderate spinal stenosis at L2-3.     Electronically Signed   By: Aundra Lee M.D.   On: 06/20/2023 16:56  PATIENT SURVEYS:  Modified Oswestry Modified Oswestry Low Back Pain Disability Questionnaire: 20 / 50 = 40.0 %   COGNITION: Overall cognitive status: Within functional limits for tasks assessed     SENSATION: WFL with light touch today patient however reports numbness that fluctuates since she has had her R THA, entire R LE at times  MUSCLE LENGTH: Hamstrings: Right wnl deg; Left wnl deg  POSTURE: in standing scoliotic posture, R lumbar concavity, L thoracic concavity, R iliac crest elevated, keeps R knee partially flexed  PALPATION: Some tenderness R piriformis region, along R lat sacral border  LUMBAR ROM:   AROM eval  Flexion P! 60%  Extension P! 20 degrees  Right lateral flexion P! Hand to 2" above knee  Left lateral flexion Hand 2 below knee no pain  Right rotation   Left rotation    (Blank rows = not tested)  LOWER EXTREMITY ROM:     AAROM   Right eval Left eval  Hip flexion 98 with p!   Hip extension    Hip abduction    Hip adduction    Hip internal rotation P! 10 degrees   Hip external rotation 20 degrees p!   Knee flexion    Knee extension    Ankle dorsiflexion    Ankle plantarflexion    Ankle inversion    Ankle eversion     (Blank rows = not tested)  LOWER EXTREMITY MMT:  all MMT wnl today LE's, able to heel and toe walk B without problems  LUMBAR SPECIAL TESTS:  Slump test: Negative and FABER test: Positive  FUNCTIONAL TESTS:  30 seconds chair stand test TBD  GAIT: Distance walked: in clinic 34' at a time, no significant deficits noted TREATMENT DATE: 07/18/23:  08/01/23 Recumbent Bike  L3x74min Feet on orange pball trunk rotation, KTC, x 10; tried  bridges but too much strain [employer] x 10 Clamshells with green tband 2x10 Green TB bridge + clamshell STM to R gluteus medius and minumus, VL PROM to RLE hip flexion, ER/IR Added black cork to L shoe for neutral pelvic alignment, last one did not last long  07/28/23 Nustep level 5 x 6 minutes Passive stretch right LE Bridges Clamshells with green tband Bridge with tband clamshells STM to the right buttock and the right quad, adductors Education about DN, possible in the future   Evaluation:  Side lying L for thoracic lumbar side bending, flexion stretch , with thoracic open books, inst to utilize for pain relief  Added 1/8" cork insert L shoe to better align iliac crest height and reduce lumbar scoliosis, able to side bend R improved Rom with less pain.  Advised to trial for the next few days for pain management, to relieve her Sx.                                                                                                                                  PATIENT EDUCATION:  Education details: POC, goals Person educated: Patient Education method: Explanation, Demonstration, Tactile cues, Verbal cues, and Handouts Education comprehension: verbalized understanding, returned demonstration, and verbal cues required  HOME EXERCISE PROGRAM: TBD  ASSESSMENT:  CLINICAL IMPRESSION: Pt responded well to treatment. Straight leg bridges on orange pball seemed to aggravate her hip a bit but no issues with conventional bridges. She still has tension in her glutes and anterior thigh. Added a new cork to her L shoe to provide neutral pelvic alignment, which she reported felt good.   Patient is a 83 y.o. female who was evaluated today by physical therapy due to R sided lat/ post hip pain, recently worsened.the most notable findings with evaluation are postural changes with scoliotic changes lumbar region as well as limitations in lumbar ROM as well as R hip ROM.  No strength deficits and some  localized soreness/ tenderness R piriformis region.  She should benefit from skilled PT to address her deficits and progress her with appropriate stretching, activity modifications to address her symptoms and assist her with long term management.     OBJECTIVE IMPAIRMENTS: difficulty walking, decreased ROM, impaired flexibility, prosthetic dependency , and pain.   ACTIVITY LIMITATIONS: bending, sitting, transfers, and locomotion level  PARTICIPATION LIMITATIONS: community activity  PERSONAL FACTORS: Age, Behavior pattern, Past/current experiences, Time since onset of injury/illness/exacerbation, and 1-2 comorbidities: B THA  are also affecting patient's functional outcome.   REHAB POTENTIAL: Good  CLINICAL DECISION MAKING: Evolving/moderate complexity  EVALUATION COMPLEXITY: Moderate   GOALS: Goals reviewed with patient? Yes  SHORT TERM GOALS: Target date: 2 weeks, 08/01/23  I HEP  Baseline: TBD , initiated one stretch today Goal status: INITIAL   LONG TERM GOALS: Target date: 8 weeks, 09/12/23  Improve R hip ROM for  flex , ER to wfl without pain Baseline: pain with R hip flex 92, ER  Goal status: INITIAL  2.  Imrpove lumbar flexibility for R SB, flexion, ext to wnl without pain R lumbar/post hip Baseline: pain end range and reduced ROM [place] chart above Goal status: INITIAL  3.  Modified oswestry improve from 20/50 or 40% to 10/50  Baseline:  Goal status: INITIAL  4.  Pt will be able to resume her usual exercise routine at fitness center without increase in Sx lumbar and R hip Baseline: has stopped completely with recent episode Goal status: INITIAL  PLAN:  PT FREQUENCY: 1-2x/week  PT DURATION: 8 weeks  PLANNED INTERVENTIONS: 97110-Therapeutic exercises, 97530- Therapeutic activity, [id number]- Neuromuscular re-education, 97535- Self Care, 16109- Manual therapy, and 97033- Ionotophoresis 4mg /ml Dexamethasone .  PLAN FOR NEXT SESSION:  give HEP, DN, she did say that the  insert broke   Samuella Crocker, PTA, 08/01/2023, 2:52 PM

## 2023-08-02 ENCOUNTER — Encounter: Payer: Self-pay | Admitting: Orthopaedic Surgery

## 2023-08-02 ENCOUNTER — Ambulatory Visit (INDEPENDENT_AMBULATORY_CARE_PROVIDER_SITE_OTHER): Admitting: Orthopaedic Surgery

## 2023-08-02 DIAGNOSIS — M7061 Trochanteric bursitis, right hip: Secondary | ICD-10-CM

## 2023-08-02 DIAGNOSIS — Z96643 Presence of artificial hip joint, bilateral: Secondary | ICD-10-CM | POA: Diagnosis not present

## 2023-08-02 NOTE — Progress Notes (Signed)
 The patient is following up after having some physical therapy on her right hip trochanteric bursitis but also as it relates to her back.  She did have an epidural steroid injection by Dr. Daisey Dryer just last month.  She says that the back is now tolerable but the hip and the IT band is what bothers her the most.  We have replaced both of her hips.  She is active 83 year old female.  On examination both hips move smoothly and fluidly.  She does have pain over the right hip trochanteric area and the IT band in her back exam seems to be more normal.  I think focus now should be entirely on her right hip trochanteric area and the IT band.  I would like her to try Voltaren gel 2-3 times a day on these areas and continue outpatient physical therapy.  Will then Krisher her back in about 2 months and we can also repeat an injection and then if needed in terms around the right hip trochanteric area and IT band.

## 2023-08-04 ENCOUNTER — Other Ambulatory Visit: Payer: Self-pay | Admitting: Family Medicine

## 2023-08-04 DIAGNOSIS — I10 Essential (primary) hypertension: Secondary | ICD-10-CM

## 2023-08-08 ENCOUNTER — Ambulatory Visit

## 2023-08-08 ENCOUNTER — Other Ambulatory Visit: Payer: Self-pay

## 2023-08-08 DIAGNOSIS — M5416 Radiculopathy, lumbar region: Secondary | ICD-10-CM

## 2023-08-08 DIAGNOSIS — M25551 Pain in right hip: Secondary | ICD-10-CM

## 2023-08-08 NOTE — Therapy (Signed)
 OUTPATIENT PHYSICAL THERAPY THORACOLUMBAR TREATMENT   Patient Name: Kristi Wiley MRN: [id number] DOB:08/02/1940, 83 y.o., female Today's Date: 08/08/2023  END OF SESSION:  PT End of Session - 08/08/23 1538     Visit Number 4    Date for PT Re-Evaluation 09/12/23    Progress Note Due on Visit 10    PT Start Time 1534    PT Stop Time 1615    PT Time Calculation (min) 41 min    Activity Tolerance Patient tolerated treatment well    Behavior During Therapy [facility] for tasks assessed/performed              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   Past Surgical History:  Procedure Laterality Date   ABDOMINAL HYSTERECTOMY  1986   BREAST SURGERY Right 2000   reconstructive     CATARACT EXTRACTION, BILATERAL  2015   eye cosmetic  Bilateral    [place]    MOHS SURGERY  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    TONSILLECTOMY     TOTAL HIP ARTHROPLASTY Right 10/06/2017   Procedure: RIGHT TOTAL HIP ARTHROPLASTY ANTERIOR APPROACH;  Surgeon: Arnie Lao, MD;  Location: [facility];  Service: Orthopedics;  Laterality: Right;   TOTAL HIP ARTHROPLASTY Left 09/06/2019   Procedure: LEFT TOTAL HIP ARTHROPLASTY ANTERIOR APPROACH;  Surgeon: Arnie Lao, MD;  Location: [facility];  Service: Orthopedics;  Laterality: Left;   TOTAL MASTECTOMY Right 01/24/1996   Patient Active Problem List   Diagnosis Date Noted   Stage 3 chronic kidney disease (HCC) 01/05/2023   Essential hypertension 11/11/2022    Elevated LDL cholesterol level 11/11/2022   Elevated TSH 11/11/2022   Dysfunction of both eustachian tubes 11/11/2022   Status post total replacement of left hip 09/06/2019   History of right hip replacement 05/17/2018   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   Pain of right hip joint 06/19/2017   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    PCP: Christianna Cowman, MD  REFERRING PROVIDER: Concetta Dee, MD  REFERRING DIAG: chronic R sciatic pain R   Rationale for Evaluation and Treatment: Rehabilitation  THERAPY DIAG:  Pain in right hip  Radiculopathy, lumbar region  ONSET DATE: chronic  SUBJECTIVE:  SUBJECTIVE STATEMENT: Pt reports went on a plane to [place]  and back to [place] her grand daughter and daughter, pain R lat leg and down front of R lower leg   Y 3 Days a week,TM 10 MIN, Utilizing isotonic equipment, abdominals, lumbar ext, leg press, knee ext weighted rows  PERTINENT HISTORY:  Chronic lower back pain, very active, h/o hip replacements B ant approach, within the last 5 years. Very active, developed R lat/post hip pain and saw her orthopedist, who referred her to PT. Also has undergone injection R L4/5 to address this pain, about 3 weeks ago.    PAIN:  Are you having pain? Yes: NPRS scale: 0 to4 Pain location: R lat hip and post hip, gluteals Pain description: pain with standing too long and can't lie on R side Aggravating factors: Takagi above Relieving factors: the shot sems to have helped some  PRECAUTIONS: None  RED FLAGS: None   WEIGHT BEARING RESTRICTIONS: No  FALLS:  Has patient fallen in last 6 months? No  LIVING ENVIRONMENT: Lives with: lives alone Lives in: House/apartment Stairs: one step into house from outside   Has following equipment at home: Single point cane  OCCUPATION: retired  PLOF: Independent  PATIENT GOALS: manage pain, be able to work out without pain lumbar and R post hip  NEXT MD VISIT: 6 weeks  OBJECTIVE:  Note: Objective measures were completed at Evaluation unless otherwise noted.  DIAGNOSTIC FINDINGS:  IMPRESSION: 1. Advanced lumbar facet arthrosis with grade 1 anterolisthesis of L3 on L4 and L4 on L5. 2. Severe spinal stenosis and severe right and moderate left neural foraminal stenosis at L4-5. 3. Moderate spinal stenosis at L3-4. 4. Mild-to-moderate spinal stenosis at L2-3.     Electronically Signed   By: Aundra Lee M.D.   On: 06/20/2023 16:56  PATIENT SURVEYS:  Modified Oswestry Modified Oswestry Low Back Pain Disability Questionnaire: 20 / 50 = 40.0 %   COGNITION: Overall cognitive status: Within functional limits for tasks assessed     SENSATION: WFL with light touch today patient however reports numbness that fluctuates since she has had her R THA, entire R LE at times  MUSCLE LENGTH: Hamstrings: Right wnl deg; Left wnl deg  POSTURE: in standing scoliotic posture, R lumbar concavity, L thoracic concavity, R iliac crest elevated, keeps R knee partially flexed  PALPATION: Some tenderness R piriformis region, along R lat sacral border  LUMBAR ROM:   AROM eval  Flexion P! 60%  Extension P! 20 degrees  Right lateral flexion P! Hand to 2" above knee  Left lateral flexion Hand 2 below knee no pain  Right rotation   Left rotation    (Blank rows = not tested)  LOWER EXTREMITY ROM:     AAROM   Right eval Left eval  Hip flexion 98 with p!   Hip extension    Hip abduction    Hip adduction    Hip internal rotation P! 10 degrees   Hip external rotation 20 degrees p!   Knee flexion    Knee extension    Ankle dorsiflexion    Ankle plantarflexion    Ankle inversion    Ankle eversion     (Blank rows = not tested)  LOWER EXTREMITY MMT:  all  MMT wnl today LE's, able to heel and toe walk B without problems  LUMBAR SPECIAL TESTS:  Slump test: Negative and FABER test: Positive  FUNCTIONAL TESTS:  30 seconds chair stand test TBD  GAIT: Distance walked:  in clinic 74' at a time, no significant deficits noted TREATMENT DATE:  08/08/23:  Nustep level 5 x 6 min Manual:  Deep massage R vastus lateralis , quads, with the theragun and with the massage stick Trigger Point Dry Needling  Initial Treatment: Pt instructed on Dry Needling rational, procedures, and possible side effects. Pt instructed to expect mild to moderate muscle soreness later in the day and/or into the next day.  Pt instructed in methods to reduce muscle soreness. Pt instructed to continue prescribed HEP. Patient was educated on signs and symptoms of infection and other risk factors and advised to seek medical attention should they occur.  Patient verbalized understanding of these instructions and education.   Patient Verbal Consent Given: Yes Education Handout Provided: Yes Muscles Treated: R vastus lateralis , R TFL, R IT band distal , R quads proximal Electrical Stimulation Performed: No Treatment Response/Outcome: decreased pain with palpation R thigh  Kinesiotaping R IT band, 2 I pieces in x to pull IT band away from underlying tissue     08/01/23 Recumbent Bike L3x6min Feet on orange pball trunk rotation, KTC, x 10; tried bridges but too much strain [employer] x 10 Clamshells with green tband 2x10 Green TB bridge + clamshell STM to R gluteus medius and minumus, VL PROM to RLE hip flexion, ER/IR Added black cork to L shoe for neutral pelvic alignment, last one did not last long   07/28/23 Nustep level 5 x 6 minutes Passive stretch right LE Bridges Clamshells with green tband Bridge with tband clamshells STM to the right buttock and the right quad, adductors Education about DN, possible in the future   Evaluation:  Side lying L for thoracic lumbar  side bending, flexion stretch , with thoracic open books, inst to utilize for pain relief  Added 1/8" cork insert L shoe to better align iliac crest height and reduce lumbar scoliosis, able to side bend R improved Rom with less pain.  Advised to trial for the next few days for pain management, to relieve her Sx.                                                                                                                                  PATIENT EDUCATION:  Education details: POC, goals Person educated: Patient Education method: Explanation, Demonstration, Tactile cues, Verbal cues, and Handouts Education comprehension: verbalized understanding, returned demonstration, and verbal cues required  HOME EXERCISE PROGRAM: TBD  ASSESSMENT:  CLINICAL IMPRESSION: Pt exquisitely tender R quads, lateral thigh musculature.  Utilized techniques designed to reduce the tissue irritation in these muscles today, did have good response immediately at end of treatment.  Her postural changes still seem to really contribute to soft tissue irritation/ overuse R lateral and post hip musculature.  Time spent today in education regarding anatomy ,spinal compression.  Next time may need additional ex to try to reduce compressive forces spine.    Patient is a  83 y.o. female who was evaluated today by physical therapy due to R sided lat/ post hip pain, recently worsened.the most notable findings with evaluation are postural changes with scoliotic changes lumbar region as well as limitations in lumbar ROM as well as R hip ROM.  No strength deficits and some localized soreness/ tenderness R piriformis region.  She should benefit from skilled PT to address her deficits and progress her with appropriate stretching, activity modifications to address her symptoms and assist her with long term management.     OBJECTIVE IMPAIRMENTS: difficulty walking, decreased ROM, impaired flexibility, prosthetic dependency , and pain.    ACTIVITY LIMITATIONS: bending, sitting, transfers, and locomotion level  PARTICIPATION LIMITATIONS: community activity  PERSONAL FACTORS: Age, Behavior pattern, Past/current experiences, Time since onset of injury/illness/exacerbation, and 1-2 comorbidities: B THA are also affecting patient's functional outcome.   REHAB POTENTIAL: Good  CLINICAL DECISION MAKING: Evolving/moderate complexity  EVALUATION COMPLEXITY: Moderate   GOALS: Goals reviewed with patient? Yes  SHORT TERM GOALS: Target date: 2 weeks, 08/01/23  I HEP  Baseline: TBD , initiated one stretch today Goal status: INITIAL   LONG TERM GOALS: Target date: 8 weeks, 09/12/23  Improve R hip ROM for flex , ER to wfl without pain Baseline: pain with R hip flex 92, ER  Goal status: INITIAL  2.  Imrpove lumbar flexibility for R SB, flexion, ext to wnl without pain R lumbar/post hip Baseline: pain end range and reduced ROM Shibley chart above Goal status: INITIAL  3.  Modified oswestry improve from 20/50 or 40% to 10/50  Baseline:  Goal status: INITIAL  4.  Pt will be able to resume her usual exercise routine at fitness center without increase in Sx lumbar and R hip Baseline: has stopped completely with recent episode Goal status: INITIAL  PLAN:  PT FREQUENCY: 1-2x/week  PT DURATION: 8 weeks  PLANNED INTERVENTIONS: 97110-Therapeutic exercises, 97530- Therapeutic activity, [id number]- Neuromuscular re-education, 97535- Self Care, 16109- Manual therapy, and 97033- Ionotophoresis 4mg /ml Dexamethasone .  PLAN FOR NEXT SESSION:  give HEP, how was TPDN   Joseph Johns L Clester Chlebowski, PT,DPT, OCS 08/08/2023, 4:40 PM

## 2023-08-09 ENCOUNTER — Other Ambulatory Visit: Payer: Self-pay | Admitting: Family Medicine

## 2023-08-09 DIAGNOSIS — E7800 Pure hypercholesterolemia, unspecified: Secondary | ICD-10-CM

## 2023-08-09 MED ORDER — PRAVASTATIN SODIUM 40 MG PO TABS
40.0000 mg | ORAL_TABLET | Freq: Every day | ORAL | 0 refills | Status: DC
Start: 2023-08-09 — End: 2023-11-14

## 2023-08-09 NOTE — Telephone Encounter (Unsigned)
 Copied from CRM [id number]. Topic: Clinical - Medication Refill >> Aug 09, 2023 11:25 AM Baldo Levan wrote: Medication: pravastatin  (PRAVACHOL ) 40 MG tablet  Has the patient contacted their pharmacy? No (Agent: If no, request that the patient contact the pharmacy for the refill. If patient does not wish to contact the pharmacy document the reason why and proceed with request.) (Agent: If yes, when and what did the pharmacy advise?)  This is the patient's preferred pharmacy:  [id number] - [place], [place] - [address] [place] [address] Phone: [phone] Fax: [phone]  Is this the correct pharmacy for this prescription? Yes If no, delete pharmacy and type the correct one.   Has the prescription been filled recently? No  Is the patient out of the medication? Yes  Has the patient been seen for an appointment in the last year OR does the patient have an upcoming appointment? Yes  Can we respond through MyChart? Yes  Agent: Please be advised that Rx refills may take up to 3 business days. We ask that you follow-up with your pharmacy.

## 2023-08-09 NOTE — Telephone Encounter (Signed)
 Last Fill: 11/11/22  Last OV: 01/06/23 AWV Next OV: 08/18/23  Routing to provider for review/authorization.   Copied from CRM [id number]. Topic: Clinical - Medication Refill >> Aug 09, 2023 11:25 AM Baldo Levan wrote: Medication: pravastatin  (PRAVACHOL ) 40 MG tablet  Has the patient contacted their pharmacy? No (Agent: If no, request that the patient contact the pharmacy for the refill. If patient does not wish to contact the pharmacy document the reason why and proceed with request.) (Agent: If yes, when and what did the pharmacy advise?)  This is the patient's preferred pharmacy:  [id number] - [place], [place] - [address] [place] [address] Phone: [phone] Fax: [phone]  Is this the correct pharmacy for this prescription? Yes If no, delete pharmacy and type the correct one.   Has the prescription been filled recently? No  Is the patient out of the medication? Yes  Has the patient been seen for an appointment in the last year OR does the patient have an upcoming appointment? Yes  Can we respond through MyChart? Yes  Agent: Please be advised that Rx refills may take up to 3 business days. We ask that you follow-up with your pharmacy.

## 2023-08-17 ENCOUNTER — Ambulatory Visit

## 2023-08-17 DIAGNOSIS — M25551 Pain in right hip: Secondary | ICD-10-CM

## 2023-08-17 DIAGNOSIS — M5416 Radiculopathy, lumbar region: Secondary | ICD-10-CM

## 2023-08-17 NOTE — Therapy (Signed)
 OUTPATIENT PHYSICAL THERAPY THORACOLUMBAR TREATMENT   Patient Name: Kristi Wiley MRN: [id number] DOB:April 14, 1940, 83 y.o., female Today's Date: 08/17/2023  END OF SESSION:  PT End of Session - 08/17/23 1359     Visit Number 5    Date for PT Re-Evaluation 09/12/23    Progress Note Due on Visit 10    PT Start Time 1311    PT Stop Time 1359    PT Time Calculation (min) 48 min    Activity Tolerance Patient tolerated treatment well    Behavior During Therapy [facility] for tasks assessed/performed               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   Past Surgical History:  Procedure Laterality Date   ABDOMINAL HYSTERECTOMY  1986   BREAST SURGERY Right 2000   reconstructive     CATARACT EXTRACTION, BILATERAL  2015   eye cosmetic  Bilateral    [place]    MOHS SURGERY  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    TONSILLECTOMY     TOTAL HIP ARTHROPLASTY Right 10/06/2017   Procedure: RIGHT TOTAL HIP ARTHROPLASTY ANTERIOR APPROACH;  Surgeon: Kristi Lao, MD;  Location: [facility];  Service: Orthopedics;  Laterality: Right;   TOTAL HIP ARTHROPLASTY Left 09/06/2019   Procedure: LEFT TOTAL HIP ARTHROPLASTY ANTERIOR APPROACH;  Surgeon: Kristi Lao, MD;  Location: [facility];  Service: Orthopedics;  Laterality: Left;   TOTAL MASTECTOMY Right 01/24/1996   Patient Active Problem List   Diagnosis Date Noted   Stage 3 chronic kidney disease (HCC) 01/05/2023   Essential hypertension 11/11/2022    Elevated LDL cholesterol level 11/11/2022   Elevated TSH 11/11/2022   Dysfunction of both eustachian tubes 11/11/2022   Status post total replacement of left hip 09/06/2019   History of right hip replacement 05/17/2018   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   Pain of right hip joint 06/19/2017   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    PCP: Kristi Cowman, MD  REFERRING PROVIDER: Concetta Dee, MD  REFERRING DIAG: chronic R sciatic pain R   Rationale for Evaluation and Treatment: Rehabilitation  THERAPY DIAG:  Pain in right hip  Radiculopathy, lumbar region  ONSET DATE: chronic  SUBJECTIVE:  SUBJECTIVE STATEMENT: Pt reports that the DN helped with her pain and not as tight  Y 3 Days a week,TM 10 MIN, Utilizing isotonic equipment, abdominals, lumbar ext, leg press, knee ext weighted rows  PERTINENT HISTORY:  Chronic lower back pain, very active, h/o hip replacements B ant approach, within the last 5 years. Very active, developed R lat/post hip pain and saw her orthopedist, who referred her to PT. Also has undergone injection R L4/5 to address this pain, about 3 weeks ago.    PAIN:  Are you having pain? Yes: NPRS scale: 0 to4 Pain location: R lat hip and post hip, gluteals Pain description: pain with standing too long and can't lie on R side Aggravating factors: Letterman above Relieving factors: the shot sems to have helped some  PRECAUTIONS: None  RED FLAGS: None   WEIGHT BEARING RESTRICTIONS: No  FALLS:  Has patient fallen in last 6 months? No  LIVING ENVIRONMENT: Lives with: lives alone Lives in: House/apartment Stairs: one step into house from outside  Has following equipment at home: Single point cane  OCCUPATION:  retired  PLOF: Independent  PATIENT GOALS: manage pain, be able to work out without pain lumbar and R post hip  NEXT MD VISIT: 6 weeks  OBJECTIVE:  Note: Objective measures were completed at Evaluation unless otherwise noted.  DIAGNOSTIC FINDINGS:  IMPRESSION: 1. Advanced lumbar facet arthrosis with grade 1 anterolisthesis of L3 on L4 and L4 on L5. 2. Severe spinal stenosis and severe right and moderate left neural foraminal stenosis at L4-5. 3. Moderate spinal stenosis at L3-4. 4. Mild-to-moderate spinal stenosis at L2-3.     Electronically Signed   By: Kristi Wiley M.D.   On: 06/20/2023 16:56  PATIENT SURVEYS:  Modified Oswestry Modified Oswestry Low Back Pain Disability Questionnaire: 20 / 50 = 40.0 %   COGNITION: Overall cognitive status: Within functional limits for tasks assessed     SENSATION: WFL with light touch today patient however reports numbness that fluctuates since she has had her R THA, entire R LE at times  MUSCLE LENGTH: Hamstrings: Right wnl deg; Left wnl deg  POSTURE: in standing scoliotic posture, R lumbar concavity, L thoracic concavity, R iliac crest elevated, keeps R knee partially flexed  PALPATION: Some tenderness R piriformis region, along R lat sacral border  LUMBAR ROM:   AROM eval  Flexion P! 60%  Extension P! 20 degrees  Right lateral flexion P! Hand to 2" above knee  Left lateral flexion Hand 2 below knee no pain  Right rotation   Left rotation    (Blank rows = not tested)  LOWER EXTREMITY ROM:     AAROM   Right eval Left eval  Hip flexion 98 with p!   Hip extension    Hip abduction    Hip adduction    Hip internal rotation P! 10 degrees   Hip external rotation 20 degrees p!   Knee flexion    Knee extension    Ankle dorsiflexion    Ankle plantarflexion    Ankle inversion    Ankle eversion     (Blank rows = not tested)  LOWER EXTREMITY MMT:  all MMT wnl today LE's, able to heel and toe walk B without  problems  LUMBAR SPECIAL TESTS:  Slump test: Negative and FABER test: Positive  FUNCTIONAL TESTS:  30 seconds chair stand test TBD  GAIT: Distance walked: in clinic 48' at a time, no significant deficits noted TREATMENT DATE:  08/17/23:  Kristi Wiley  level 5 x 6 min Supine LTR, DKTC, bridge orange pball x 10 each S/L clamshells YTB x 10 B Supine pelvic tilt 10x3" Supine march + TrA yellow TB 10x3" SKTC x 30 " B Seated low grips rows 25lb 2x10  STM to B quads, L hip adductor magnus  08/08/23:  Nustep level 5 x 6 min Manual:  Deep massage R vastus lateralis , quads, with the theragun and with the massage stick Trigger Point Dry Needling  Initial Treatment: Pt instructed on Dry Needling rational, procedures, and possible side effects. Pt instructed to expect mild to moderate muscle soreness later in the day and/or into the next day.  Pt instructed in methods to reduce muscle soreness. Pt instructed to continue prescribed HEP. Patient was educated on signs and symptoms of infection and other risk factors and advised to seek medical attention should they occur.  Patient verbalized understanding of these instructions and education.   Patient Verbal Consent Given: Yes Education Handout Provided: Yes Muscles Treated: R vastus lateralis , R TFL, R IT band distal , R quads proximal Electrical Stimulation Performed: No Treatment Response/Outcome: decreased pain with palpation R thigh  Kinesiotaping R IT band, 2 I pieces in x to pull IT band away from underlying tissue     08/01/23 Recumbent Bike L3x36min Feet on orange pball trunk rotation, KTC, x 10; tried bridges but too much strain [employer] x 10 Clamshells with green tband 2x10 Green TB bridge + clamshell STM to R gluteus medius and minumus, VL PROM to RLE hip flexion, ER/IR Added black cork to L shoe for neutral pelvic alignment, last one did not last long   07/28/23 Nustep level 5 x 6 minutes Passive stretch right  LE Bridges Clamshells with green tband Bridge with tband clamshells STM to the right buttock and the right quad, adductors Education about DN, possible in the future   Evaluation:  Side lying L for thoracic lumbar side bending, flexion stretch , with thoracic open books, inst to utilize for pain relief  Added 1/8" cork insert L shoe to better align iliac crest height and reduce lumbar scoliosis, able to side bend R improved Rom with less pain.  Advised to trial for the next few days for pain management, to relieve her Sx.                                                                                                                                  PATIENT EDUCATION:  Education details: POC, goals Person educated: Patient Education method: Explanation, Demonstration, Tactile cues, Verbal cues, and Handouts Education comprehension: verbalized understanding, returned demonstration, and verbal cues required  HOME EXERCISE PROGRAM: Access Code: [email or web address] URL: [email or web address] Date: 08/17/2023 Prepared by: Trimaine Maser  Exercises - Supine March with Resistance Band  - 1 x daily - 7 x weekly - 2 sets - 10 reps - 3 sec hold - Supine Posterior Pelvic Tilt  - 1 x  daily - 7 x weekly - 2 sets - 10 reps - 3 sec hold - Clamshell with Resistance  - 1 x daily - 3 x weekly - 2 sets - 10 reps  ASSESSMENT:  CLINICAL IMPRESSION: We gently progressed with lateral hip strengthening and postural strengthening. Pt was very tight in the L hip adductors today. She needed cues to avoid painful ROM and for correct form.     Patient is a 83 y.o. female who was evaluated today by physical therapy due to R sided lat/ post hip pain, recently worsened.the most notable findings with evaluation are postural changes with scoliotic changes lumbar region as well as limitations in lumbar ROM as well as R hip ROM.  No strength deficits and some localized soreness/ tenderness R piriformis region.   She should benefit from skilled PT to address her deficits and progress her with appropriate stretching, activity modifications to address her symptoms and assist her with long term management.     OBJECTIVE IMPAIRMENTS: difficulty walking, decreased ROM, impaired flexibility, prosthetic dependency , and pain.   ACTIVITY LIMITATIONS: bending, sitting, transfers, and locomotion level  PARTICIPATION LIMITATIONS: community activity  PERSONAL FACTORS: Age, Behavior pattern, Past/current experiences, Time since onset of injury/illness/exacerbation, and 1-2 comorbidities: B THA are also affecting patient's functional outcome.   REHAB POTENTIAL: Good  CLINICAL DECISION MAKING: Evolving/moderate complexity  EVALUATION COMPLEXITY: Moderate   GOALS: Goals reviewed with patient? Yes  SHORT TERM GOALS: Target date: 2 weeks, 08/01/23  I HEP  Baseline: TBD , initiated one stretch today Goal status: INITIAL   LONG TERM GOALS: Target date: 8 weeks, 09/12/23  Improve R hip ROM for flex , ER to wfl without pain Baseline: pain with R hip flex 92, ER  Goal status: INITIAL  2.  Imrpove lumbar flexibility for R SB, flexion, ext to wnl without pain R lumbar/post hip Baseline: pain end range and reduced ROM Reichart chart above Goal status: INITIAL  3.  Modified oswestry improve from 20/50 or 40% to 10/50  Baseline:  Goal status: INITIAL  4.  Pt will be able to resume her usual exercise routine at fitness center without increase in Sx lumbar and R hip Baseline: has stopped completely with recent episode Goal status: INITIAL  PLAN:  PT FREQUENCY: 1-2x/week  PT DURATION: 8 weeks  PLANNED INTERVENTIONS: 97110-Therapeutic exercises, 97530- Therapeutic activity, [id number]- Neuromuscular re-education, 97535- Self Care, 47829- Manual therapy, and 97033- Ionotophoresis 4mg /ml Dexamethasone .  PLAN FOR NEXT SESSION:  assess response to HEP, how was TPDN   Lajuana Patchell L Meshelle Holness, PTA 08/17/2023, 1:59 PM

## 2023-08-18 ENCOUNTER — Ambulatory Visit (INDEPENDENT_AMBULATORY_CARE_PROVIDER_SITE_OTHER): Admitting: Family Medicine

## 2023-08-18 ENCOUNTER — Encounter: Payer: Self-pay | Admitting: Family Medicine

## 2023-08-18 VITALS — BP 128/78 | HR 52 | Temp 97.7°F | Ht 65.0 in | Wt 161.4 lb

## 2023-08-18 DIAGNOSIS — E7800 Pure hypercholesterolemia, unspecified: Secondary | ICD-10-CM | POA: Diagnosis not present

## 2023-08-18 DIAGNOSIS — Z780 Asymptomatic menopausal state: Secondary | ICD-10-CM | POA: Diagnosis not present

## 2023-08-18 DIAGNOSIS — N183 Chronic kidney disease, stage 3 unspecified: Secondary | ICD-10-CM | POA: Diagnosis not present

## 2023-08-18 DIAGNOSIS — Z131 Encounter for screening for diabetes mellitus: Secondary | ICD-10-CM | POA: Diagnosis not present

## 2023-08-18 DIAGNOSIS — I10 Essential (primary) hypertension: Secondary | ICD-10-CM | POA: Diagnosis not present

## 2023-08-18 LAB — COMPREHENSIVE METABOLIC PANEL WITH GFR
ALT: 15 U/L (ref 0–35)
AST: 18 U/L (ref 0–37)
Albumin: 4.2 g/dL (ref 3.5–5.2)
Alkaline Phosphatase: 34 U/L — ABNORMAL LOW (ref 39–117)
BUN: 24 mg/dL — ABNORMAL HIGH (ref 6–23)
CO2: 32 meq/L (ref 19–32)
Calcium: 9.7 mg/dL (ref 8.4–10.5)
Chloride: 103 meq/L (ref 96–112)
Creatinine, Ser: 1.14 mg/dL (ref 0.40–1.20)
GFR: 44.35 mL/min — ABNORMAL LOW (ref >60.00)
Glucose, Bld: 85 mg/dL (ref 70–99)
Potassium: 4 meq/L (ref 3.5–5.1)
Sodium: 142 meq/L (ref 135–145)
Total Bilirubin: 0.6 mg/dL (ref 0.2–1.2)
Total Protein: 6.1 g/dL (ref 6.0–8.3)

## 2023-08-18 LAB — CBC
HCT: 36.9 % (ref 36.0–46.0)
Hemoglobin: 12.8 g/dL (ref 12.0–15.0)
MCHC: 34.5 g/dL (ref 30.0–36.0)
MCV: 89.9 fl (ref 78.0–100.0)
Platelets: 235 10*3/uL (ref 150.0–400.0)
RBC: 4.11 Mil/uL (ref 3.87–5.11)
RDW: 13.5 % (ref 11.5–15.5)
WBC: 7.3 10*3/uL (ref 4.0–10.5)

## 2023-08-18 LAB — LDL CHOLESTEROL, DIRECT: Direct LDL: 72 mg/dL

## 2023-08-18 LAB — HEMOGLOBIN A1C: Hgb A1c MFr Bld: 5.2 % (ref 4.6–6.5)

## 2023-08-18 NOTE — Progress Notes (Signed)
 Established Patient Office Visit   Subjective:  Patient ID: Kristi Wiley, female    DOB: 09-20-1940  Age: 83 y.o. MRN: [id number]  Chief Complaint  Patient presents with   Medical Management of Chronic Issues    Follow up. Pt is not fasting. Rx refill for pravastatin .     HPI Encounter Diagnoses  Name Primary?   Elevated LDL cholesterol level Yes   Postmenopausal estrogen deficiency    Stage 3 chronic kidney disease, unspecified whether stage 3a or 3b CKD (HCC)    Essential hypertension    Screening for diabetes mellitus    For follow-up of her elevated elevated cholesterol.  She is currently seeing orthopedics for ongoing back and hip issues.  She is also being followed by nephrology for CKD.  Cardiology is managing her hypertension.  No recent DEXA scan.  She had been diagnosed with osteopenia in the past and took calcium with vitamin D .  She is due for Tdap.  Continues pravastatin  without issue for elevated LDL cholesterol. Review of Systems  Constitutional: Negative.   HENT: Negative.    Eyes:  Negative for blurred vision, discharge and redness.  Respiratory: Negative.    Cardiovascular: Negative.   Gastrointestinal:  Negative for abdominal pain.  Genitourinary: Negative.   Musculoskeletal:  Positive for back pain and joint pain. Negative for myalgias.  Skin:  Negative for rash.  Neurological:  Negative for tingling, loss of consciousness and weakness.  Endo/Heme/Allergies:  Negative for polydipsia.     Current Outpatient Medications:    amLODipine (NORVASC) 2.5 MG tablet, Take 2.5 mg by mouth daily., Disp: , Rfl:    Apoaequorin (PREVAGEN) 10 MG CAPS, , Disp: , Rfl:    aspirin  EC 81 MG tablet, Take 81 mg by mouth daily. Swallow whole., Disp: , Rfl:    Biotin  10000 MCG TABS, Take 10,000 mcg by mouth daily after supper. , Disp: , Rfl:    cholecalciferol  (VITAMIN D ) 1000 units tablet, Take 2,000 Units by mouth daily., Disp: , Rfl:    labetalol  (NORMODYNE ) 200 MG tablet,  Take 200 mg by mouth 2 (two) times daily., Disp: , Rfl:    losartan -hydrochlorothiazide  (HYZAAR) 100-12.5 MG tablet, Take 1 tablet by mouth daily at 12 noon. Mid day, Disp: 90 tablet, Rfl: 1   Multiple Vitamin (MULTIVITAMIN WITH MINERALS) TABS tablet, Take 1 tablet by mouth daily. Centrum Silver for Women, Disp: , Rfl:    Omega-3 Fatty Acids (FISH OIL) 1000 MG CAPS, Take 1,000 mg by mouth daily with supper. , Disp: , Rfl:    OVER THE COUNTER MEDICATION, Take 1 tablet by mouth 2 (two) times daily. Viviscal Advanced Hair Health, Disp: , Rfl:    pravastatin  (PRAVACHOL ) 40 MG tablet, Take 1 tablet (40 mg total) by mouth at bedtime., Disp: 90 tablet, Rfl: 0   psyllium (METAMUCIL) 58.6 % packet, Take by mouth daily., Disp: , Rfl:    FIBER PO, Take 4.2 g by mouth daily. Metamucil, Disp: , Rfl:    Objective:     BP 128/78 (Cuff Size: Normal)   Pulse (!) 52   Temp 97.7 F (36.5 C) (Temporal)   Ht 5\' 5"  (1.651 m)   Wt 161 lb 6.4 oz (73.2 kg)   SpO2 97%   BMI 26.86 kg/m  BP Readings from Last 3 Encounters:  08/18/23 128/78  06/29/23 (!) 180/69  01/06/23 122/60   Wt Readings from Last 3 Encounters:  08/18/23 161 lb 6.4 oz (73.2 kg)  01/06/23 154 lb  9.6 oz (70.1 kg)  01/05/23 155 lb (70.3 kg)      Physical Exam Constitutional:      General: She is not in acute distress.    Appearance: Normal appearance. She is not ill-appearing, toxic-appearing or diaphoretic.  HENT:     Head: Normocephalic and atraumatic.     Right Ear: External ear normal.     Left Ear: External ear normal.     Mouth/Throat:     Mouth: Mucous membranes are moist.     Pharynx: Oropharynx is clear. No oropharyngeal exudate or posterior oropharyngeal erythema.  Eyes:     General: No scleral icterus.       Right eye: No discharge.        Left eye: No discharge.     Extraocular Movements: Extraocular movements intact.     Conjunctiva/sclera: Conjunctivae normal.     Pupils: Pupils are equal, round, and reactive to  light.  Cardiovascular:     Rate and Rhythm: Normal rate and regular rhythm.  Pulmonary:     Effort: Pulmonary effort is normal. No respiratory distress.     Breath sounds: Normal breath sounds. No wheezing, rhonchi or rales.  Abdominal:     General: Bowel sounds are normal.  Musculoskeletal:     Cervical back: No rigidity or tenderness.  Lymphadenopathy:     Cervical: No cervical adenopathy.  Skin:    General: Skin is warm and dry.  Neurological:     Mental Status: She is alert and oriented to person, place, and time.  Psychiatric:        Mood and Affect: Mood normal.        Behavior: Behavior normal.      No results found for any visits on 08/18/23.    The ASCVD Risk score (Arnett DK, et al., 2019) failed to calculate for the following reasons:   The 2019 ASCVD risk score is only valid for ages 42 to 37    Assessment & Plan:   Elevated LDL cholesterol level -     Comprehensive metabolic panel with GFR -     LDL cholesterol, direct  Postmenopausal estrogen deficiency -     DG Bone Density; Future  Stage 3 chronic kidney disease, unspecified whether stage 3a or 3b CKD (HCC) -     Comprehensive metabolic panel with GFR  Essential hypertension -     CBC -     Comprehensive metabolic panel with GFR  Screening for diabetes mellitus -     Comprehensive metabolic panel with GFR -     Hemoglobin A1c    Return in about 6 months (around 02/18/2024), or Please [facility] pharmacist for your tetanus shot..  Checking LDL today.  She will return fasting in the fall for the full profile.  Will continue follow-up with cardiology and nephrology.  She is compliant with all of her medications.  Blood pressure is well-controlled.  She will work on staying hydrated.  Screening for diabetes.  Rechecking DEXA scan.  Tonna Frederic, MD

## 2023-08-21 ENCOUNTER — Ambulatory Visit: Payer: Self-pay | Admitting: Family Medicine

## 2023-08-22 ENCOUNTER — Other Ambulatory Visit: Payer: Self-pay

## 2023-08-22 ENCOUNTER — Ambulatory Visit

## 2023-08-22 DIAGNOSIS — M5416 Radiculopathy, lumbar region: Secondary | ICD-10-CM

## 2023-08-22 DIAGNOSIS — M25551 Pain in right hip: Secondary | ICD-10-CM | POA: Diagnosis not present

## 2023-08-22 NOTE — Therapy (Signed)
 OUTPATIENT PHYSICAL THERAPY THORACOLUMBAR TREATMENT   Patient Name: Kristi Wiley MRN: [id number] DOB:10-29-1940, 83 y.o., female Today's Date: 08/22/2023  END OF SESSION:  PT End of Session - 08/22/23 1700     Visit Number 6    Date for PT Re-Evaluation 09/12/23    Progress Note Due on Visit 10    PT Start Time 1400    PT Stop Time 1432    PT Time Calculation (min) 32 min    Activity Tolerance Patient tolerated treatment well    Behavior During Therapy [facility] for tasks assessed/performed                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   Past Surgical History:  Procedure Laterality Date   ABDOMINAL HYSTERECTOMY  1986   BREAST SURGERY Right 2000   reconstructive     CATARACT EXTRACTION, BILATERAL  2015   eye cosmetic  Bilateral    [place]    MOHS SURGERY  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    TONSILLECTOMY     TOTAL HIP ARTHROPLASTY Right 10/06/2017   Procedure: RIGHT TOTAL HIP ARTHROPLASTY ANTERIOR APPROACH;  Surgeon: Arnie Lao, MD;  Location: [facility];  Service: Orthopedics;  Laterality: Right;   TOTAL HIP ARTHROPLASTY Left 09/06/2019   Procedure: LEFT TOTAL HIP ARTHROPLASTY ANTERIOR APPROACH;  Surgeon: Arnie Lao, MD;  Location: [facility];  Service: Orthopedics;  Laterality: Left;   TOTAL MASTECTOMY Right 01/24/1996   Patient Active Problem List   Diagnosis Date Noted   Stage 3 chronic kidney disease (HCC) 01/05/2023   Essential hypertension 11/11/2022    Elevated LDL cholesterol level 11/11/2022   Elevated TSH 11/11/2022   Dysfunction of both eustachian tubes 11/11/2022   Status post total replacement of left hip 09/06/2019   History of right hip replacement 05/17/2018   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   Pain of right hip joint 06/19/2017   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    PCP: Christianna Cowman, MD  REFERRING PROVIDER: Concetta Dee, MD  REFERRING DIAG: chronic R sciatic pain R   Rationale for Evaluation and Treatment: Rehabilitation  THERAPY DIAG:  Pain in right hip  Radiculopathy, lumbar region  ONSET DATE: chronic  SUBJECTIVE:  SUBJECTIVE STATEMENT: Pt reports that today will be her final PT treatment.  She is overall improved, still pain R lower leg with prolonged walking  Y 3 Days a week,TM 10 MIN, Utilizing isotonic equipment, abdominals, lumbar ext, leg press, knee ext weighted rows  PERTINENT HISTORY:  Chronic lower back pain, very active, h/o hip replacements B ant approach, within the last 5 years. Very active, developed R lat/post hip pain and saw her orthopedist, who referred her to PT. Also has undergone injection R L4/5 to address this pain, about 3 weeks ago.    PAIN:  Are you having pain? Yes: NPRS scale: 0 to4 Pain location: R lat hip and post hip, gluteals Pain description: pain with standing too long and can't lie on R side Aggravating factors: Glas above Relieving factors: the shot sems to have helped some  PRECAUTIONS: None  RED FLAGS: None   WEIGHT BEARING RESTRICTIONS: No  FALLS:  Has patient fallen in last 6 months? No  LIVING ENVIRONMENT: Lives with: lives alone Lives in: House/apartment Stairs: one step into house from outside  Has  following equipment at home: Single point cane  OCCUPATION: retired  PLOF: Independent  PATIENT GOALS: manage pain, be able to work out without pain lumbar and R post hip  NEXT MD VISIT: 6 weeks  OBJECTIVE:  Note: Objective measures were completed at Evaluation unless otherwise noted.  DIAGNOSTIC FINDINGS:  IMPRESSION: 1. Advanced lumbar facet arthrosis with grade 1 anterolisthesis of L3 on L4 and L4 on L5. 2. Severe spinal stenosis and severe right and moderate left neural foraminal stenosis at L4-5. 3. Moderate spinal stenosis at L3-4. 4. Mild-to-moderate spinal stenosis at L2-3.     Electronically Signed   By: Aundra Lee M.D.   On: 06/20/2023 16:56  PATIENT SURVEYS:  Modified Oswestry Modified Oswestry Low Back Pain Disability Questionnaire: 20 / 50 = 40.0 %   COGNITION: Overall cognitive status: Within functional limits for tasks assessed     SENSATION: WFL with light touch today patient however reports numbness that fluctuates since she has had her R THA, entire R LE at times  MUSCLE LENGTH: Hamstrings: Right wnl deg; Left wnl deg  POSTURE: in standing scoliotic posture, R lumbar concavity, L thoracic concavity, R iliac crest elevated, keeps R knee partially flexed  PALPATION: Some tenderness R piriformis region, along R lat sacral border  LUMBAR ROM:   AROM eval 08/22/23:   Flexion P! 60% wfl  Extension P! 20 degrees wfl  Right lateral flexion P! Hand to 2" above knee wfl  Left lateral flexion Hand 2 below knee no pain wfl  Right rotation    Left rotation     (Blank rows = not tested)  LOWER EXTREMITY ROM:     AAROM   Right eval Left eval 08/22/23:  Hip flexion 98 with p!  100  Hip extension     Hip abduction     Hip adduction     Hip internal rotation P! 10 degrees    Hip external rotation 20 degrees p!  Wnl, able to tailor sit today  Knee flexion     Knee extension     Ankle dorsiflexion     Ankle plantarflexion     Ankle inversion      Ankle eversion      (Blank rows = not tested)  LOWER EXTREMITY MMT:  all MMT wnl today LE's, able to heel and toe walk B without problems  LUMBAR SPECIAL TESTS:  Slump  test: Negative and FABER test: Positive  FUNCTIONAL TESTS:  30 seconds chair stand test TBD  GAIT: Distance walked: in clinic 43' at a time, no significant deficits noted TREATMENT DATE:  08/22/23:  Reassessed hip and lumbar ROM:Hosea above Side lying L for deep cross friction massage R lateral and post hip musculature, primarily with theragun Seated for lat pull downs 15 # x 2 sets each 15 reps Reviewed HEP  08/17/23:  Nustep level 5 x 6 min Supine LTR, DKTC, bridge orange pball x 10 each S/L clamshells YTB x 10 B Supine pelvic tilt 10x3" Supine march + TrA yellow TB 10x3" SKTC x 30 " B Seated low grips rows 25lb 2x10  STM to B quads, L hip adductor magnus  08/08/23:  Nustep level 5 x 6 min Manual:  Deep massage R vastus lateralis , quads, with the theragun and with the massage stick Trigger Point Dry Needling  Initial Treatment: Pt instructed on Dry Needling rational, procedures, and possible side effects. Pt instructed to expect mild to moderate muscle soreness later in the day and/or into the next day.  Pt instructed in methods to reduce muscle soreness. Pt instructed to continue prescribed HEP. Patient was educated on signs and symptoms of infection and other risk factors and advised to seek medical attention should they occur.  Patient verbalized understanding of these instructions and education.   Patient Verbal Consent Given: Yes Education Handout Provided: Yes Muscles Treated: R vastus lateralis , R TFL, R IT band distal , R quads proximal Electrical Stimulation Performed: No Treatment Response/Outcome: decreased pain with palpation R thigh  Kinesiotaping R IT band, 2 I pieces in x to pull IT band away from underlying tissue     08/01/23 Recumbent Bike L3x68min Feet on orange pball trunk  rotation, KTC, x 10; tried bridges but too much strain [employer] x 10 Clamshells with green tband 2x10 Green TB bridge + clamshell STM to R gluteus medius and minumus, VL PROM to RLE hip flexion, ER/IR Added black cork to L shoe for neutral pelvic alignment, last one did not last long   07/28/23 Nustep level 5 x 6 minutes Passive stretch right LE Bridges Clamshells with green tband Bridge with tband clamshells STM to the right buttock and the right quad, adductors Education about DN, possible in the future   Evaluation:  Side lying L for thoracic lumbar side bending, flexion stretch , with thoracic open books, inst to utilize for pain relief  Added 1/8" cork insert L shoe to better align iliac crest height and reduce lumbar scoliosis, able to side bend R improved Rom with less pain.  Advised to trial for the next few days for pain management, to relieve her Sx.                                                                                                                                  PATIENT EDUCATION:  Education  details: POC, goals Person educated: Patient Education method: Explanation, Demonstration, Tactile cues, Verbal cues, and Handouts Education comprehension: verbalized understanding, returned demonstration, and verbal cues required  HOME EXERCISE PROGRAM: Access Code: [email or web address] URL: [email or web address] Date: 08/17/2023 Prepared by: Braylin Clark  Exercises - Supine March with Resistance Band  - 1 x daily - 7 x weekly - 2 sets - 10 reps - 3 sec hold - Supine Posterior Pelvic Tilt  - 1 x daily - 7 x weekly - 2 sets - 10 reps - 3 sec hold - Clamshell with Resistance  - 1 x daily - 3 x weekly - 2 sets - 10 reps  ASSESSMENT:  CLINICAL IMPRESSION: Pt reports that she plans to stop PT at this time.  Responded the best to TPDN and to soft tissue work. Unsure whether insert  L shoe is helping.  Has resumed gentle work outs at gym.  Has met most of her goals  except still hampered with prolonged walking, primarily R ant lower leg.  Non tender now with palpation R thigh.  Dc at this time.   Patient is a 83 y.o. female who was evaluated today by physical therapy due to R sided lat/ post hip pain, recently worsened.the most notable findings with evaluation are postural changes with scoliotic changes lumbar region as well as limitations in lumbar ROM as well as R hip ROM.  No strength deficits and some localized soreness/ tenderness R piriformis region.  She should benefit from skilled PT to address her deficits and progress her with appropriate stretching, activity modifications to address her symptoms and assist her with long term management.     OBJECTIVE IMPAIRMENTS: difficulty walking, decreased ROM, impaired flexibility, prosthetic dependency , and pain.   ACTIVITY LIMITATIONS: bending, sitting, transfers, and locomotion level  PARTICIPATION LIMITATIONS: community activity  PERSONAL FACTORS: Age, Behavior pattern, Past/current experiences, Time since onset of injury/illness/exacerbation, and 1-2 comorbidities: B THA are also affecting patient's functional outcome.   REHAB POTENTIAL: Good  CLINICAL DECISION MAKING: Evolving/moderate complexity  EVALUATION COMPLEXITY: Moderate   GOALS: Goals reviewed with patient? Yes  SHORT TERM GOALS: Target date: 2 weeks, 08/01/23  I HEP  Baseline: TBD , initiated one stretch today Goal status: INITIAL   LONG TERM GOALS: Target date: 8 weeks, 09/12/23  Improve R hip ROM for flex , ER to wfl without pain Baseline: pain with R hip flex 92, ER  Goal status: 08/21/33: met  2.  Imrpove lumbar flexibility for R SB, flexion, ext to wnl without pain R lumbar/post hip Baseline: pain end range and reduced ROM Cirrincione chart above Goal status: 08/22/23: met  3.  Modified oswestry improve from 20/50 or 40% to 10/50  Baseline:  Goal status: INITIAL  4.  Pt will be able to resume her usual exercise routine at  fitness center without increase in Sx lumbar and R hip Baseline: has stopped completely with recent episode Goal status: 08/22/23: has resumed gentle ex at this time progressing  PLAN:  PT FREQUENCY: 1-2x/week  PT DURATION: 8 weeks  PLANNED INTERVENTIONS: 97110-Therapeutic exercises, 97530- Therapeutic activity, 97112- Neuromuscular re-education, 97535- Self Care, 18299- Manual therapy, and 97033- Ionotophoresis 4mg /ml Dexamethasone .  PLAN FOR NEXT SESSION:  PT wishes to DC at this time   Donivan Furry, PT , DPT, OCS 08/22/2023, 5:02 PM

## 2023-08-28 ENCOUNTER — Ambulatory Visit (HOSPITAL_BASED_OUTPATIENT_CLINIC_OR_DEPARTMENT_OTHER)
Admission: RE | Admit: 2023-08-28 | Discharge: 2023-08-28 | Disposition: A | Discharge status: Home / Self Care | Payer: Medicare Other | Source: Ambulatory Visit | Attending: Cardiology | Admitting: Cardiology

## 2023-08-28 ENCOUNTER — Ambulatory Visit: Payer: Self-pay | Admitting: Cardiology

## 2023-08-28 DIAGNOSIS — I340 Nonrheumatic mitral (valve) insufficiency: Secondary | ICD-10-CM | POA: Insufficient documentation

## 2023-08-28 LAB — ECHOCARDIOGRAM COMPLETE
AR max vel: 1.93 cm2
AV Area VTI: 1.85 cm2
AV Area mean vel: 1.77 cm2
AV Mean grad: 3 mmHg
AV Peak grad: 6.4 mmHg
AV Vena cont: 0.3 cm
Ao pk vel: 1.26 m/s
Area-P 1/2: 1.19 cm2
Calc EF: 67 %
MV M vel: 6.29 m/s
MV Peak grad: 158.3 mmHg
MV VTI: 1.12 cm2
MV Vena cont: 0.2 cm
P 1/2 time: 1654 ms
Radius: 0.45 cm
S' Lateral: 3.2 cm
Single Plane A2C EF: 60.3 %
Single Plane A4C EF: 71.6 %

## 2023-09-21 ENCOUNTER — Telehealth (HOSPITAL_BASED_OUTPATIENT_CLINIC_OR_DEPARTMENT_OTHER): Payer: Self-pay

## 2023-10-04 ENCOUNTER — Encounter: Payer: Self-pay | Admitting: Orthopaedic Surgery

## 2023-10-04 ENCOUNTER — Ambulatory Visit (INDEPENDENT_AMBULATORY_CARE_PROVIDER_SITE_OTHER): Admitting: Orthopaedic Surgery

## 2023-10-04 DIAGNOSIS — Z96643 Presence of artificial hip joint, bilateral: Secondary | ICD-10-CM | POA: Diagnosis not present

## 2023-10-04 DIAGNOSIS — M7061 Trochanteric bursitis, right hip: Secondary | ICD-10-CM

## 2023-10-04 NOTE — Progress Notes (Signed)
 The patient comes in today for continued follow-up as a relates to right hip trochanteric bursitis but also significant lumbar spinal stenosis.  She is a very active 83 year old female.  She says physical therapy that utilized dry needling and acupuncture as well as massage helped her quite a bit.  She says her back just is tired and she still has neuropathy but she is much better overall.  She is ambulating without an assist device.  She is not interested in being referred to a spine surgeon as of yet.  That does make sense based on how she is doing.  Both hips move smoothly and fluidly.  Her pain is minimal today.  She is mobilizing pretty well.  She is a younger 50.  At this point follow-up can be as needed.  If things worsen at all in any way she will let us  know.  She did show me a device that she is in order that it looks like its almost like a TENS unit and I think is reasonable to try for her back.  If she does not have having worsening problems she will let us  know and come back and Moss us .

## 2023-10-17 ENCOUNTER — Telehealth: Payer: Self-pay | Admitting: Family Medicine

## 2023-10-17 NOTE — Telephone Encounter (Signed)
 error

## 2023-10-18 ENCOUNTER — Ambulatory Visit
Admission: EM | Admit: 2023-10-18 | Discharge: 2023-10-18 | Disposition: A | Discharge status: Home / Self Care | Attending: Family Medicine | Admitting: Family Medicine

## 2023-10-18 ENCOUNTER — Other Ambulatory Visit: Payer: Self-pay

## 2023-10-18 DIAGNOSIS — H60391 Other infective otitis externa, right ear: Secondary | ICD-10-CM

## 2023-10-18 HISTORY — DX: Spinal stenosis, site unspecified: M48.00

## 2023-10-18 HISTORY — DX: Polyneuropathy, unspecified: G62.9

## 2023-10-18 HISTORY — DX: Endocarditis, valve unspecified: I38

## 2023-10-18 MED ORDER — NEOMYCIN-POLYMYXIN-HC 3.5-10000-1 OT SUSP: 4.0000 [drp] | Freq: Three times a day (TID) | OTIC | 0 refills | Status: DC

## 2023-10-18 NOTE — Discharge Instructions (Signed)
 Use the eardrops as directed Metzner your doctor if not improved by next week

## 2023-10-18 NOTE — ED Triage Notes (Signed)
 C/o right ear pain x 4-5 days. No fever. Has used otc drops.

## 2023-10-18 NOTE — ED Provider Notes (Signed)
 [facility]    CSN: [id number] Arrival date & time: 10/18/23  1019      History   Chief Complaint Chief Complaint  Patient presents with   Ear Fullness    HPI Maleigha Colvard Lackman is a 83 y.o. female.   Patient states that she wears hearing aids.  She states that she cannot wear them for a long period of time because they will cause irritation under at her ear canals.  She is here right now because she has pain in her right ear that is worsening over the last 5 days.  Hearing is normal.  No drainage or discharge.  No recent cough or cold symptoms.    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   Elevated cholesterol    Heart valve disorder    History of right hip replacement 05/17/2018   Hypertension    Mitral valve disorder    sicne birth    Neuropathy    Pain of right hip joint 06/19/2017   Spinal stenosis    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   Tinnitus    Unilateral primary osteoarthritis, 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   Unilateral primary osteoarthritis, 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    Patient Active Problem List   Diagnosis Date Noted   Stage 3 chronic kidney disease (HCC) 01/05/2023   Essential hypertension 11/11/2022   Elevated LDL cholesterol level 11/11/2022   Elevated TSH 11/11/2022   Dysfunction of both eustachian tubes 11/11/2022   Status post total replacement of left hip 09/06/2019   History of right hip replacement 05/17/2018   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   Pain of right hip joint 06/19/2017   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    Past Surgical History:  Procedure Laterality Date   ABDOMINAL HYSTERECTOMY  1986   BREAST SURGERY Right 2000   reconstructive     CATARACT EXTRACTION, BILATERAL  2015   eye cosmetic  Bilateral    [place]     MOHS SURGERY  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      Home Medications    Prior to Admission medications   Medication Sig Start Date End Date Taking? Authorizing Provider  hydrochlorothiazide  (HYDRODIURIL ) 25 MG tablet Take 25 mg by mouth daily.   Yes [provider]  losartan  (COZAAR ) 100 MG tablet Take 100 mg by mouth daily.   Yes [provider]  neomycin -polymyxin-hydrocortisone (CORTISPORIN) 3.5-10000-1 OTIC suspension Place 4 drops into the right ear 3 (three) times daily. 10/18/23  Yes Maranda Jamee Jacob, MD  amLODipine (NORVASC) 2.5 MG tablet Take 2.5 mg by mouth daily. 06/10/22   [provider]  Apoaequorin (PREVAGEN) 10 MG CAPS  07/17/22   [provider]  aspirin  EC 81  MG tablet Take 81 mg by mouth daily. Swallow whole.    [provider]  Biotin  10000 MCG TABS Take 10,000 mcg by mouth daily after supper.     [provider]  cholecalciferol  (VITAMIN D ) 1000 units tablet Take 2,000 Units by mouth daily.    [provider]  labetalol  (NORMODYNE ) 200 MG tablet Take 200 mg by mouth 2 (two) times daily.    [provider]  Multiple Vitamin (MULTIVITAMIN WITH MINERALS) TABS tablet Take 1 tablet by mouth daily. Centrum Silver for Women    [provider]  Omega-3 Fatty Acids (FISH OIL) 1000 MG CAPS Take 1,000 mg by mouth daily with supper.     [provider]  OVER THE COUNTER MEDICATION Take 1 tablet  by mouth 2 (two) times daily. Viviscal [facility] [employer] [facility]    [provider]  pravastatin  (PRAVACHOL ) 40 MG tablet Take 1 tablet (40 mg total) by mouth at bedtime. 08/09/23   Berneta Elsie Sayre, MD  psyllium (METAMUCIL) 58.6 % packet Take by mouth daily.    [provider]    Family History Family History  Problem Relation Age of Onset   CAD Father     Social History Social History   Tobacco Use   Smoking status: Never   Smokeless tobacco: Never  Vaping Use   Vaping status: Never Used  Substance Use Topics   Alcohol  use: Not Currently    Comment: rare , wine     Drug use: Never     Allergies   Nitrofurantoin, Sulfonamide derivatives, and Latex   Review of Systems Review of Systems  [place] HPI Physical Exam Triage Vital Signs ED Triage Vitals  Encounter Vitals Group     BP 10/18/23 1028 114/61     Girls Systolic BP Percentile --      Girls Diastolic BP Percentile --      Boys Systolic BP Percentile --      Boys Diastolic BP Percentile --      Pulse Rate 10/18/23 1028 63     Resp 10/18/23 1028 18     Temp 10/18/23 1028 98 F (36.7 C)     Temp src --      SpO2 10/18/23 1028 95 %     Weight --      Height --      Head Circumference --      Peak Flow --      Pain Score 10/18/23 1029 4     Pain Loc --      Pain Education --      Exclude from Growth Chart --    No data found.  Updated Vital Signs BP 114/61   Pulse 63   Temp 98 F (36.7 C)   Resp 18   SpO2 95%        Physical Exam Constitutional:      General: She is not in acute distress.    Appearance: She is well-developed and normal weight.  HENT:     Head: Normocephalic and atraumatic.     Right Ear: Tympanic membrane normal.     Left Ear: Tympanic membrane normal.     Ears:     Comments: There is partial wax obstruction in the right ear.  Visualized TM appears normal and shiny.  There is some maceration of the outer ear canal and pain with traction of pinna    Nose:  Nose normal. No congestion.  Eyes:  Conjunctiva/sclera: Conjunctivae normal.     Pupils: Pupils are equal, round, and reactive to light.  Cardiovascular:     Rate and Rhythm: Normal rate.  Pulmonary:     Effort: Pulmonary effort is normal.  Musculoskeletal:        General: Normal range of motion.     Cervical back: Normal range of motion.  Skin:    General: Skin is warm and dry.  Neurological:     Mental Status: She is alert.      UC Treatments / Results  Labs (all labs ordered are listed, but only abnormal results are displayed) Labs Reviewed - No data to display  EKG   Radiology No results found.  Procedures Procedures (including critical care time)  Medications Ordered in UC Medications - No data to display  Initial Impression / Assessment and Plan / UC Course  I have reviewed the triage vital signs and the nursing notes.  Pertinent labs & imaging results that were available during my care of the patient were reviewed by me and considered in my medical decision making ([place] chart for details).     Final Clinical Impressions(s) / UC Diagnoses   Final diagnoses:  Infective otitis externa, right     Discharge Instructions      Use the eardrops as directed Brahmbhatt your doctor if not improved by next week     ED Prescriptions     Medication Sig Dispense Auth. Provider   neomycin -polymyxin-hydrocortisone (CORTISPORIN) 3.5-10000-1 OTIC suspension Place 4 drops into the right ear 3 (three) times daily. 10 mL Maranda Jamee Jacob, MD      PDMP not reviewed this encounter.   Maranda Jamee Jacob, MD 10/18/23 1310

## 2023-10-31 ENCOUNTER — Ambulatory Visit (HOSPITAL_BASED_OUTPATIENT_CLINIC_OR_DEPARTMENT_OTHER)
Admission: RE | Admit: 2023-10-31 | Discharge: 2023-10-31 | Disposition: A | Discharge status: Home / Self Care | Source: Ambulatory Visit | Attending: Family Medicine | Admitting: Family Medicine

## 2023-10-31 DIAGNOSIS — Z780 Asymptomatic menopausal state: Secondary | ICD-10-CM | POA: Diagnosis present

## 2023-11-13 ENCOUNTER — Other Ambulatory Visit: Payer: Self-pay | Admitting: Family Medicine

## 2023-11-13 DIAGNOSIS — E7800 Pure hypercholesterolemia, unspecified: Secondary | ICD-10-CM

## 2024-01-12 ENCOUNTER — Ambulatory Visit: Payer: Medicare Other

## 2024-01-12 VITALS — BP 136/60 | HR 58 | Temp 98.7°F | Ht 66.0 in | Wt 161.4 lb

## 2024-01-12 DIAGNOSIS — Z0000 Encounter for general adult medical examination without abnormal findings: Secondary | ICD-10-CM | POA: Diagnosis not present

## 2024-01-12 NOTE — Progress Notes (Signed)
 Subjective:   Kristi Wiley is a 83 y.o. who presents for a Medicare Wellness preventive visit.  As a reminder, Annual Wellness Visits don't include a physical exam, and some assessments may be limited, especially if this visit is performed virtually. We may recommend an in-person follow-up visit with your provider if needed.  Visit Complete: In person    Persons Participating in Visit: Patient.  AWV Questionnaire: Yes: Patient Medicare AWV questionnaire was completed by the patient on 01/10/2024; I have confirmed that all information answered by patient is correct and no changes since this date.        Objective:    Today's Vitals   01/12/24 1254  BP: 136/60  Pulse: (!) 58  Temp: 98.7 F (37.1 C)  TempSrc: Oral  SpO2: 93%  Weight: 161 lb 6.4 oz (73.2 kg)  Height: 5' 6 (1.676 m)   Body mass index is 26.05 kg/m.     01/12/2024    1:07 PM 07/19/2023    4:56 PM 01/06/2023    1:09 PM 09/06/2019   11:00 AM 08/27/2019    2:27 PM 10/06/2017   12:13 PM 10/02/2017    2:30 PM  Advanced Directives  Does Patient Have a Medical Advance Directive? Yes Yes Yes No No No  No   Type of Estate agent of [place];Living will  Healthcare Power of [place];Living will      Copy of Healthcare Power of Attorney in Chart? Yes - validated most recent copy scanned in chart ([place] row information)  Yes - validated most recent copy scanned in chart ([place] row information)      Would patient like information on creating a medical advance directive?    Yes (MAU/Ambulatory/Procedural Areas - Information given) Yes (MAU/Ambulatory/Procedural Areas - Information given) No - Patient declined  No - Patient declined      Data saved with a previous flowsheet row definition    Current Medications (verified) Outpatient Encounter Medications as of 01/12/2024  Medication Sig   amLODipine (NORVASC) 2.5 MG tablet Take 2.5 mg by mouth daily.   Apoaequorin (PREVAGEN) 10 MG CAPS    aspirin  EC  81 MG tablet Take 81 mg by mouth daily. Swallow whole.   Biotin  10000 MCG TABS Take 10,000 mcg by mouth daily after supper.    cholecalciferol  (VITAMIN D ) 1000 units tablet Take 2,000 Units by mouth daily.   hydrochlorothiazide  (HYDRODIURIL ) 25 MG tablet Take 25 mg by mouth daily.   labetalol  (NORMODYNE ) 200 MG tablet Take 200 mg by mouth 2 (two) times daily.   losartan  (COZAAR ) 100 MG tablet Take 100 mg by mouth daily.   Multiple Vitamin (MULTIVITAMIN WITH MINERALS) TABS tablet Take 1 tablet by mouth daily. Centrum Silver for Women   Omega-3 Fatty Acids (FISH OIL) 1000 MG CAPS Take 1,000 mg by mouth daily with supper.    OVER THE COUNTER MEDICATION Take 1 tablet by mouth 2 (two) times daily. Viviscal Advanced Hair Health   pravastatin  (PRAVACHOL ) 40 MG tablet TAKE 1 TABLET(40 MG) BY MOUTH AT BEDTIME   psyllium (METAMUCIL) 58.6 % packet Take by mouth daily.   neomycin -polymyxin-hydrocortisone (CORTISPORIN) 3.5-10000-1 OTIC suspension Place 4 drops into the right ear 3 (three) times daily.   No facility-administered encounter medications on file as of 01/12/2024.    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   Tinnitus    Unilateral primary osteoarthritis, 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   Unilateral primary osteoarthritis, 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   Past Surgical History:  Procedure Laterality Date   ABDOMINAL HYSTERECTOMY  1986   BREAST SURGERY Right 2000   reconstructive     CATARACT  EXTRACTION, BILATERAL  2015   eye cosmetic  Bilateral    [place]    MOHS SURGERY  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    TONSILLECTOMY     TOTAL HIP ARTHROPLASTY Right 10/06/2017   Procedure: RIGHT TOTAL HIP ARTHROPLASTY ANTERIOR APPROACH;  Surgeon: Vernetta Lonni GRADE, Kristi Wiley;  Location: [facility];  Service: Orthopedics;  Laterality: Right;   TOTAL HIP ARTHROPLASTY Left 09/06/2019   Procedure: LEFT TOTAL HIP ARTHROPLASTY ANTERIOR APPROACH;  Surgeon: Vernetta Lonni GRADE, Kristi Wiley;  Location: [facility];  Service: Orthopedics;  Laterality: Left;   TOTAL MASTECTOMY Right 01/24/1996   Family History  Problem Relation Age of Onset   CAD Father    Social History   Socioeconomic History   Marital status: Widowed    Spouse name: Not on file   Number of children: Not on file   Years of education: Not on file   Highest education level: Associate degree: academic program  Occupational History   Not on file  Tobacco Use   Smoking status: Never   Smokeless tobacco: Never  Vaping Use   Vaping status: Never Used  Substance and Sexual Activity   Alcohol  use: Not Currently    Comment: rare , wine     Drug use: Never   Sexual activity: Not on file  Other Topics Concern   Not on file  Social History Narrative   Not on file   Social Drivers of Health   Financial Resource Strain: Low Risk  (01/12/2024)   Overall Financial Resource Strain (CARDIA)    Difficulty of Paying Living Expenses: Not hard at all  Food Insecurity: No Food Insecurity (01/12/2024)   Hunger Vital Sign    Worried About Running Out of Food in the Last Year: Never true    Ran Out of Food in the Last Year: Never true  Transportation Needs: No Transportation Needs (01/12/2024)   PRAPARE - Administrator, Civil Service (Medical): No    Lack of Transportation (Non-Medical): No  Physical Activity: Sufficiently Active (01/12/2024)   Exercise Vital Sign    Days of Exercise per Week: 3 days    Minutes of Exercise per Session:  60 min  Stress: No Stress Concern Present (01/12/2024)   [employer] of Occupational Health - Occupational Stress Questionnaire    Feeling of Stress: Not at all  Social Connections: Moderately Integrated (01/12/2024)   Social Connection and Isolation Panel    Frequency of Communication with Friends and Family: More than three times a week    Frequency of Social Gatherings with Friends and Family: Three times a week    Attends Religious Services: More than 4 times per year    Active Member of Clubs or Organizations: Yes    Attends  Club or Organization Meetings: More than 4 times per year    Marital Status: Widowed    Tobacco Counseling Counseling given: Not Answered    Clinical Intake:  Pre-visit preparation completed: Yes  Pain : No/denies pain     Nutritional Status: BMI 25 -29 Overweight Nutritional Risks: None Diabetes: No  Lab Results  Component Value Date   HGBA1C 5.2 08/18/2023     How often Kristi Wiley you need to have someone help you when you read instructions, pamphlets, or other written materials from your doctor or pharmacy?: 1 - Never  Interpreter Needed?: No  Information entered by :: Kristi Wiley   Activities of Daily Living     01/10/2024    3:38 PM  In your present state of health, Kristi Wiley you have any difficulty performing the following activities:  Hearing? 1  Comment does not wear hearing aids all the time  Vision? 0  Difficulty concentrating or making decisions? 0  Walking or climbing stairs? 0  Dressing or bathing? 0  Doing errands, shopping? 0  Preparing Food and eating ? N  Using the Toilet? N  In the past six months, have you accidently leaked urine? Y  Kristi Wiley you have problems with loss of bowel control? N  Managing your Medications? N  Managing your Finances? N  Housekeeping or managing your Housekeeping? Y  Comment taking time    Patient Care Team: Kristi Elsie Sayre, Kristi Wiley as PCP - General (Family Medicine) Kristi Cy RAMAN, Kristi Wiley  (Osteopathic Medicine)  I have updated your Care Teams any recent Medical Services you may have received from other providers in the past year.     Assessment:   This is a routine wellness examination for Kristi Wiley.  Hearing/Vision screen Hearing Screening - Comments:: Has hearing aids that she does not wear all the time Vision Screening - Comments:: Regular eye exams, [facility], Kristi Wiley   Goals Addressed             This Visit's Progress    Patient Stated       01/12/2024, stay independent       Depression Screen     01/12/2024    1:10 PM 01/06/2023    1:11 PM 01/05/2023    1:35 PM 11/11/2022    1:28 PM  PHQ 2/9 Scores  PHQ - 2 Score 0 0 0 0  PHQ- 9 Score 1       Fall Risk     01/10/2024    3:38 PM 01/05/2023    1:35 PM 01/03/2023    2:05 PM 11/11/2022    1:27 PM  Fall Risk   Falls in the past year? 0 0 0 0  Number falls in past yr: 0 0 0 0  Injury with Fall? 0 0 0 0  Risk for fall due to : Medication side effect No Fall Risks Medication side effect No Fall Risks  Follow up Falls prevention discussed;Falls evaluation completed Falls evaluation completed Falls prevention discussed;Falls evaluation completed Falls prevention discussed    MEDICARE RISK AT HOME:  Medicare Risk at Home Any stairs in or around the home?: (Patient-Rptd) Yes If so, are there any without handrails?: (Patient-Rptd) Yes Home free of loose throw rugs in walkways, pet beds, electrical cords, etc?: (Patient-Rptd) No Adequate lighting in your home to reduce risk of falls?: (Patient-Rptd) Yes Life alert?: (Patient-Rptd) No Use of a cane, walker or w/c?: (Patient-Rptd) No Grab bars in the bathroom?: (Patient-Rptd) No Shower chair or bench  in shower?: (Patient-Rptd) Yes Elevated toilet seat or a handicapped toilet?: (Patient-Rptd) Yes  TIMED UP AND GO:  Was the test performed?  Yes  Length of time to ambulate 10 feet: 5 sec Gait steady and fast without use of assistive  device  Cognitive Function: 6CIT completed        01/12/2024    1:12 PM 01/06/2023    1:11 PM  6CIT Screen  What Year? 0 points 0 points  What month? 0 points 0 points  What time? 0 points 0 points  Count back from 20 0 points 0 points  Months in reverse 0 points 0 points  Repeat phrase 0 points 0 points  Total Score 0 points 0 points    Immunizations  There is no immunization history on file for this patient.  Screening Tests Health Maintenance  Topic Date Due   COVID-19 Vaccine (1) Never done   DTaP/Tdap/Td (1 - Tdap) Never done   Zoster Vaccines- Shingrix (1 of 2) Never done   Mammogram  Never done   Medicare Annual Wellness (AWV)  01/11/2025   Pneumococcal Vaccine: 50+ Years  Completed   Influenza Vaccine  Completed   DEXA SCAN  Completed   Meningococcal B Vaccine  Aged Out    Health Maintenance Items Addressed: Vaccines Due: TDAP, shingles, Had mammogram 09/25/2023  Additional Screening:  Vision Screening: Recommended annual ophthalmology exams for early detection of glaucoma and other disorders of the eye. Is the patient up to date with their annual eye exam?  Yes  Who is the provider or what is the name of the office in which the patient attends annual eye exams? Dr. Kennyth  Dental Screening: Recommended annual dental exams for proper oral hygiene  Community Resource Referral / Chronic Care Management: CRR required this visit?  No   CCM required this visit?  No   Plan:    I have personally reviewed and noted the following in the patient's chart:   Medical and social history Use of alcohol , tobacco or illicit drugs  Current medications and supplements including opioid prescriptions. Patient is not currently taking opioid prescriptions. Functional ability and status Nutritional status Physical activity Advanced directives List of other physicians Hospitalizations, surgeries, and ER visits in previous 12 months Vitals Screenings to include  cognitive, depression, and falls Referrals and appointments  In addition, I have reviewed and discussed with patient certain preventive protocols, quality metrics, and best practice recommendations. A written personalized care plan for preventive services as well as general preventive health recommendations were provided to patient.   Ardella FORBES Dawn, Wiley   [id number]   After Visit Summary: (In Person-Printed) AVS printed and given to the patient  Notes: Nothing significant to report at this time.

## 2024-01-12 NOTE — Patient Instructions (Addendum)
 Ms. Kristi Wiley,  Thank you for taking the time for your Medicare Wellness Visit. I appreciate your continued commitment to your health goals. Please review the care plan we discussed, and feel free to reach out if I can assist you further.  Medicare recommends these wellness visits once per year to help you and your care team stay ahead of potential health issues. These visits are designed to focus on prevention, allowing your provider to concentrate on managing your acute and chronic conditions during your regular appointments.  Please note that Annual Wellness Visits do not include a physical exam. Some assessments may be limited, especially if the visit was conducted virtually. If needed, we may recommend a separate in-person follow-up with your provider.  Ongoing Care Seeing your primary care provider every 3 to 6 months helps us  monitor your health and provide consistent, personalized care.   Referrals If a referral was made during today's visit and you haven't received any updates within two weeks, please contact the referred provider directly to check on the status.  Recommended Screenings:  Health Maintenance  Topic Date Due   COVID-19 Vaccine (1) Never done   DTaP/Tdap/Td vaccine (1 - Tdap) Never done   Zoster (Shingles) Vaccine (1 of 2) Never done   Breast Cancer Screening  Never done   Medicare Annual Wellness Visit  01/11/2025   Pneumococcal Vaccine for age over 17  Completed   Flu Shot  Completed   DEXA scan (bone density measurement)  Completed   Meningitis B Vaccine  Aged Out       01/12/2024    1:07 PM  Advanced Directives  Does Patient Have a Medical Advance Directive? Yes  Type of Estate agent of [place];Living will  Copy of Healthcare Power of Attorney in Chart? Yes - validated most recent copy scanned in chart ([place] row information)   Advance Care Planning is important because it: Ensures you receive medical care that aligns with your values,  goals, and preferences. Provides guidance to your family and loved ones, reducing the emotional burden of decision-making during critical moments.  Vision: Annual vision screenings are recommended for early detection of glaucoma, cataracts, and diabetic retinopathy. These exams can also reveal signs of chronic conditions such as diabetes and high blood pressure.  Dental: Annual dental screenings help detect early signs of oral cancer, gum disease, and other conditions linked to overall health, including heart disease and diabetes.  Please [place] the attached documents for additional preventive care recommendations.

## 2024-01-29 ENCOUNTER — Encounter: Payer: Self-pay | Admitting: Radiology

## 2024-02-09 ENCOUNTER — Other Ambulatory Visit: Payer: Self-pay | Admitting: Family Medicine

## 2024-02-09 DIAGNOSIS — E7800 Pure hypercholesterolemia, unspecified: Secondary | ICD-10-CM

## 2024-02-19 ENCOUNTER — Ambulatory Visit: Admitting: Family Medicine

## 2024-03-07 ENCOUNTER — Encounter: Payer: Self-pay | Admitting: Family Medicine

## 2024-03-07 ENCOUNTER — Ambulatory Visit: Payer: Self-pay | Admitting: Family Medicine

## 2024-03-07 ENCOUNTER — Ambulatory Visit (INDEPENDENT_AMBULATORY_CARE_PROVIDER_SITE_OTHER): Admitting: Family Medicine

## 2024-03-07 VITALS — BP 130/64 | HR 57 | Temp 98.2°F | Ht 66.0 in | Wt 161.8 lb

## 2024-03-07 DIAGNOSIS — H9319 Tinnitus, unspecified ear: Secondary | ICD-10-CM | POA: Diagnosis not present

## 2024-03-07 DIAGNOSIS — Z9889 Other specified postprocedural states: Secondary | ICD-10-CM

## 2024-03-07 DIAGNOSIS — G5793 Unspecified mononeuropathy of bilateral lower limbs: Secondary | ICD-10-CM

## 2024-03-07 DIAGNOSIS — R928 Other abnormal and inconclusive findings on diagnostic imaging of breast: Secondary | ICD-10-CM

## 2024-03-07 DIAGNOSIS — E7800 Pure hypercholesterolemia, unspecified: Secondary | ICD-10-CM

## 2024-03-07 DIAGNOSIS — I10 Essential (primary) hypertension: Secondary | ICD-10-CM

## 2024-03-07 DIAGNOSIS — N183 Chronic kidney disease, stage 3 unspecified: Secondary | ICD-10-CM

## 2024-03-07 LAB — URINALYSIS, ROUTINE W REFLEX MICROSCOPIC
Bilirubin Urine: NEGATIVE
Hgb urine dipstick: NEGATIVE
Ketones, ur: NEGATIVE
Nitrite: NEGATIVE
RBC / HPF: NONE SEEN (ref 0–?)
Specific Gravity, Urine: <=1.005 — AB (ref 1.000–1.030)
Total Protein, Urine: NEGATIVE
Urine Glucose: NEGATIVE
Urobilinogen, UA: 0.2 (ref 0.0–1.0)
pH: 6 (ref 5.0–8.0)

## 2024-03-07 LAB — LDL CHOLESTEROL, DIRECT: Direct LDL: 67 mg/dL

## 2024-03-07 LAB — BASIC METABOLIC PANEL WITH GFR
BUN: 23 mg/dL (ref 6–23)
CO2: 32 meq/L (ref 19–32)
Calcium: 9.7 mg/dL (ref 8.4–10.5)
Chloride: 101 meq/L (ref 96–112)
Creatinine, Ser: 1.2 mg/dL (ref 0.40–1.20)
GFR: 41.54 mL/min — ABNORMAL LOW (ref >60.00)
Glucose, Bld: 101 mg/dL — ABNORMAL HIGH (ref 70–99)
Potassium: 4.1 meq/L (ref 3.5–5.1)
Sodium: 140 meq/L (ref 135–145)

## 2024-03-07 LAB — VITAMIN B12: Vitamin B-12: 831 pg/mL (ref 211–911)

## 2024-03-07 NOTE — Progress Notes (Addendum)
 "  Established Patient Office Visit   Subjective:  Patient ID: Kristi Wiley, female    DOB: Apr 09, 1940  Age: 83 y.o. MRN: [id number]  Chief Complaint  Patient presents with   Follow-up    Follow up meds.     C/o still having Neuropathy in feet and ringing in ears.     HPI Encounter Diagnoses  Name Primary?   Tinnitus, unspecified laterality Yes   Neuropathy of both feet    Essential hypertension    Stage 3 chronic kidney disease, unspecified whether stage 3a or 3b CKD (HCC)    Elevated LDL cholesterol level    History of lumpectomy of left breast    Other abnormal and inconclusive findings on diagnostic imaging of breast    Follow-up of above.  Status post evaluation for longstanding tinnitus by ENT.  Longstanding history of paresthesias in both feet.  She believes may have been associated with hip surgeries.  It is not painful.  TSH recently checked and was found to be normal.  Recent A1c at 5.2.  She is seen nephrology for hypertension and 3B CKD.  She typically hydrates well.  No difficulty with urination, dysuria or urgency.  On occasion she experiences frequency.  History of malignant lumpectomy of left breast in 2023.  Yearly diagnostic mammograms were recommended.  Last diagnostic mammogram was in 2024.   Review of Systems  Constitutional: Negative.   HENT: Negative.    Eyes:  Negative for blurred vision, discharge and redness.  Respiratory: Negative.    Cardiovascular: Negative.   Gastrointestinal:  Negative for abdominal pain.  Genitourinary: Negative.   Musculoskeletal: Negative.  Negative for myalgias.  Skin:  Negative for rash.  Neurological:  Positive for tingling. Negative for loss of consciousness and weakness.  Endo/Heme/Allergies:  Negative for polydipsia.      01/12/2024    1:10 PM 01/06/2023    1:11 PM 01/05/2023    1:35 PM  Depression screen PHQ 2/9  Decreased Interest 0 0 0  Down, Depressed, Hopeless 0 0 0  PHQ - 2 Score 0 0 0  Altered sleeping 1     Tired, decreased energy 0    Change in appetite 0    Feeling bad or failure about yourself  0    Trouble concentrating 0    Moving slowly or fidgety/restless 0    Suicidal thoughts 0    PHQ-9 Score 1     Difficult doing work/chores Not difficult at all       Data saved with a previous flowsheet row definition      Current Medications[1]   Objective:     BP 130/64   Pulse (!) 57   Temp 98.2 F (36.8 C) (Temporal)   Ht 5' 6 (1.676 m)   Wt 161 lb 12.8 oz (73.4 kg)   SpO2 97%   BMI 26.12 kg/m    Physical Exam Constitutional:      General: She is not in acute distress.    Appearance: Normal appearance. She is not ill-appearing, toxic-appearing or diaphoretic.  HENT:     Head: Normocephalic and atraumatic.     Right Ear: External ear normal.     Left Ear: External ear normal.     Mouth/Throat:     Mouth: Mucous membranes are moist.     Pharynx: Oropharynx is clear. No oropharyngeal exudate or posterior oropharyngeal erythema.  Eyes:     General: No scleral icterus.       Right eye:  No discharge.        Left eye: No discharge.     Extraocular Movements: Extraocular movements intact.     Conjunctiva/sclera: Conjunctivae normal.     Pupils: Pupils are equal, round, and reactive to light.  Cardiovascular:     Rate and Rhythm: Normal rate and regular rhythm.  Pulmonary:     Effort: Pulmonary effort is normal. No respiratory distress.     Breath sounds: Normal breath sounds. No wheezing or rales.  Abdominal:     General: Bowel sounds are normal.  Musculoskeletal:     Cervical back: No rigidity or tenderness.  Lymphadenopathy:     Cervical: No cervical adenopathy.  Skin:    General: Skin is warm and dry.  Neurological:     Mental Status: She is alert and oriented to person, place, and time.  Psychiatric:        Mood and Affect: Mood normal.        Behavior: Behavior normal.      Results for orders placed or performed in visit on 03/07/24  Vitamin B12   Result Value Ref Range   Vitamin B-12 831 211 - 911 pg/mL  Basic metabolic panel with GFR  Result Value Ref Range   Sodium 140 135 - 145 mEq/L   Potassium 4.1 3.5 - 5.1 mEq/L   Chloride 101 96 - 112 mEq/L   CO2 32 19 - 32 mEq/L   Glucose, Bld 101 (H) 70 - 99 mg/dL   BUN 23 6 - 23 mg/dL   Creatinine, Ser 8.79 0.40 - 1.20 mg/dL   GFR 58.45 (L) >39.99 mL/min   Calcium 9.7 8.4 - 10.5 mg/dL  LDL cholesterol, direct  Result Value Ref Range   Direct LDL 67.0 mg/dL  Urinalysis, Routine w reflex microscopic  Result Value Ref Range   Color, Urine YELLOW Yellow;Lt. Yellow;Straw;Dark Yellow;Amber;Green;Red;Brown   APPearance Cloudy (A) Clear;Turbid;Slightly Cloudy;Cloudy   Specific Gravity, Urine <=1.005 (A) 1.000 - 1.030   pH 6.0 5.0 - 8.0   Total Protein, Urine NEGATIVE Negative   Urine Glucose NEGATIVE Negative   Ketones, ur NEGATIVE Negative   Bilirubin Urine NEGATIVE Negative   Hgb urine dipstick NEGATIVE Negative   Urobilinogen, UA 0.2 0.0 - 1.0   Leukocytes,Ua SMALL (A) Negative   Nitrite NEGATIVE Negative   WBC, UA 11-20/hpf (A) 0-2/hpf   RBC / HPF none seen 0-2/hpf   Mucus, UA Presence of (A) None   Squamous Epithelial / HPF Rare(0-4/hpf) Rare(0-4/hpf)   Bacteria, UA Many(>50/hpf) (A) None      The ASCVD Risk score (Arnett DK, et al., 2019) failed to calculate for the following reasons:   The 2019 ASCVD risk score is only valid for ages 10 to 64   * - Cholesterol units were assumed    Assessment & Plan:   Tinnitus, unspecified laterality  Neuropathy of both feet -     Vitamin B12  Essential hypertension -     Basic metabolic panel with GFR -     Urinalysis, Routine w reflex microscopic  Stage 3 chronic kidney disease, unspecified whether stage 3a or 3b CKD (HCC) -     Basic metabolic panel with GFR  Elevated LDL cholesterol level -     LDL cholesterol, direct  History of lumpectomy of left breast -     MM 3D DIAGNOSTIC MAMMOGRAM UNILATERAL LEFT BREAST;  Future  Other abnormal and inconclusive findings on diagnostic imaging of breast -     MM 3D  DIAGNOSTIC MAMMOGRAM UNILATERAL LEFT BREAST; Future    Return in about 6 months (around 09/05/2024).    Elsie Sim Lent, MD     [1]  Current Outpatient Medications:    amLODipine (NORVASC) 2.5 MG tablet, Take 2.5 mg by mouth daily., Disp: , Rfl:    Apoaequorin (PREVAGEN) 10 MG CAPS, , Disp: , Rfl:    aspirin  EC 81 MG tablet, Take 81 mg by mouth daily. Swallow whole., Disp: , Rfl:    Biotin  89999 MCG TABS, Take 10,000 mcg by mouth daily after supper. , Disp: , Rfl:    cholecalciferol  (VITAMIN D ) 1000 units tablet, Take 2,000 Units by mouth daily., Disp: , Rfl:    hydrochlorothiazide  (HYDRODIURIL ) 25 MG tablet, Take 25 mg by mouth daily., Disp: , Rfl:    labetalol  (NORMODYNE ) 200 MG tablet, Take 200 mg by mouth 2 (two) times daily., Disp: , Rfl:    losartan  (COZAAR ) 100 MG tablet, Take 100 mg by mouth daily., Disp: , Rfl:    Multiple Vitamin (MULTIVITAMIN WITH MINERALS) TABS tablet, Take 1 tablet by mouth daily. Centrum Silver for Women, Disp: , Rfl:    Omega-3 Fatty Acids (FISH OIL) 1000 MG CAPS, Take 1,000 mg by mouth daily with supper. , Disp: , Rfl:    OVER THE COUNTER MEDICATION, Take 1 tablet by mouth 2 (two) times daily. Viviscal Advanced Hair Health, Disp: , Rfl:    pravastatin  (PRAVACHOL ) 40 MG tablet, TAKE 1 TABLET(40 MG) BY MOUTH AT BEDTIME, Disp: 90 tablet, Rfl: 0   psyllium (METAMUCIL) 58.6 % packet, Take by mouth daily., Disp: , Rfl:   "

## 2024-03-13 ENCOUNTER — Encounter: Payer: Self-pay | Admitting: Family Medicine

## 2024-04-25 NOTE — Addendum Note (Signed)
 Addended by: BERNETA ELSIE LABOR on: 04/25/2024 05:12 PM   Modules accepted: Orders

## 2024-04-26 ENCOUNTER — Encounter: Payer: Self-pay | Admitting: Family Medicine

## 2024-09-05 ENCOUNTER — Ambulatory Visit: Admitting: Family Medicine

## 2025-01-17 ENCOUNTER — Ambulatory Visit
